# Patient Record
Sex: Female | Born: 1985 | Race: Black or African American | Hispanic: No | Marital: Married | Smoking: Never smoker
Health system: Southern US, Community
[De-identification: ages and names within clinical notes are randomized; demographics above are authoritative.]

## PROBLEM LIST (undated history)

## (undated) NOTE — *Deleted (*Deleted)
Patient ID: Gabrielle Singh, female   DOB: [birth year], 60 y.o.   MRN: [id number]   After hospitalization 10/15-10/22/2021 and subsequent rehab 10/29-10/30(which she left AMA.)  From discharge summary: Disposition and follow-up:   Gabrielle Singh was discharged from [facility] in [place] condition.  At the hospital follow up visit please address:  1. Iron deficiency anemia 2/2 blood loss anemia. Severe ulceration vs pancreatitis vs malignancy in distal stomach or proximal duodenum. Ferritin 6, iron 15. Concern for GI bleeding, PUD vs malignancy. Could also be from chronic malnutrition. Received IV feraheme x2 and 1 unit pRBCs with appropriate response. Treating with protonix 40mg  BID. She declined EGD, please recheck CBC and consider working on agreement to proceed with EGD.   2. Hypoglycemia. No history of diabetes and not on any diabetic medications. Could be from depleted glycogen stores or impaired gluconeogenesis. CBGs tend to go <70 when she does not eat. Serum insulin and c-peptide WNL. AM cortisol WNL. Recommend seeing an endocrinologist as outpatient.  3. Chronic malnutrition with associated electrolyte abnormalities. Hx of bulimia in her teens. Likely has an eating disorder. Has not been eating well for past 2 years. Albumin 1.4. Have been encouraging increased food intake. She would benefit from working with a therapist and nutritionist. Will be discharging to [facility] for further care.  4. Left arm flaccid paralysis. Also with variable bilateral leg weakness. Negative CT head, MRI brain, and MRI C-spine. May benefit from outpatient EMG and nerve conduction studies. Hopefully will benefit from CIR care, including PMR and neuropsych. Will need ongoing work with neuro and psych.   2.  Labs / imaging needed at time of follow-up: CBC, BMP  3.  Pending labs/ test needing follow-up: HbA1c  Follow-up Appointments:  Hospital Course by problem list: 1. Left-sided abdominal  pain: CT abd/pel showing suspected non-perforated posterolateral wall duodenal bulb ulcer with prominent fluid within pancreaticoduodenal groove, concerning for severe ulceration vs pancreatitis vs malignancy in distal stomach or proximal duodenum. Pain resolved without intervention.   Patient appears to have an underlying undiagnosed psychiatric illness that may be contributing to her current presentation.She does have a reported history of bulimia, possible underlying anxiety and eating disorder. GI was consulted and recommended an EGD, however the patient refused consent for the procedure. Per GI's recommendation she is being treated empirically with Protonix 40 mg BID.Patient is not complaining of abdominal pain. Overall the recommendation is follow up outpatient with her PCP. She should consider an upper EGD at a later date, or alternatively obtain a repeat CT scan in a month or so.  Iron deficiency anemia: Clear iron deficiency with ferritin 6, iron 15. Concern for GI bleeding, possibly PUD vs malignancy.Other possibility would be malnutrition or malabsorption.Patient received IV iron x2and 1 unit pRBCs with appropriate response. Hemoglobin has since remained stable. Hgb at discharge is 9.0,LDH is 93, and haptoglobin is 94.   Hypoglycemia: Patient with no history of diabetes or on any diabetic medications.Possibly due to depleted glycogen stores with chronic malnutrition.Patient's CBGs have been inconsistent with PO intake and have required supplementation with IV dextrose for CBGs <70. We considered the possibility of insulinoma, and exogenous insulin use, and ordered lab values for insulin (<0.4), C-peptide (0.9), and sulfonylurea (still pending). We screened for adrenal insufficieny however, unlikely provided her hypokalemia and her AM cortisol level was 12.1 (WNL). It is our recommendation that the patient follow up with outpatient endocrinology for further assessment.    ModerateMalnutrition (Hypokalemia, hypomagnesemia): Husband reports that patient  has not been eating well for past 2 years.She reports no intake for past week before admission. At high risk for refeeding syndrome. Her hepatic panel was significant for decreasedalbumin 1.4 and total protein 5.2, otherwise normal. Urinalysis was significant for proteinuria and ketonuria. We supplemented her with IV thiamine, folate, MVA, Kcl and Mg. Her potassium and magnesium levels have have remained low despite repletion. Her electrolytes should continued to be monitored after discharge. Our recommendation is that the patient consider outpatient resources for eating disorders, including working with a therapist and nutritionist to improve her quality of life.   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 CT head was negative for acute intracranial abnormality. MRI brain and c-spine were also negative. We consulted with Dr. Amada Jupiter from Neurology, and he believes that there is nothing to do in the inpatient setting. He advised that patient may benefit from EMG and nerve conduction studies as an outpatient. Our PT/OT colleagues worked with her to improve strength and functionality. Their recommendation was that the patient needs 24 hours assistance and would benefit from inpatient rehabilitation.   Pressure Injury: She had a stage 1 nonblanchable coccyx wound over her bony prominence at admission. It was measured as 1 cm x 0.8 cm.Recommended dressing changed every 3 days, unless soiled.   From discharge summary after leaving physical rehab 10/30:  Brief HPI:   Gabrielle Singh is a 60 y.o. right-handed female with history of bulimia as a teenager rheumatoid arthritis that reportedly was diagnosed during a telehealth visit although on no medications, chronic anemia.  Patient not received medical care in the past 8 years.   Presented 04/15/2020 with left-sided weakness abdominal pain as well as intermittent left-sided weakness.  Patient reportedly notes this is progressed over the last 4 months.  Admission chemistry sodium 134 potassium 3.4 BUN 6 creatinine 0.62 hemoglobin 8.2 lipase 34 TSH within normal limits.  Cranial CT scan MRI negative for acute changes MRI cervical spine unremarkable.  CT angiogram of the chest with no pulmonary emboli.  CTA of the abdomen pelvis showed grooved pancreatitis with secondary duodenitis although felt to be less likely but not excluded.  Thickened proximal duodenum with suspected nonperforated ulceration along the posterior lateral wall of the duodenal bulb with prominent fluid within the pancreaticoduodenal groove.  Gastroenterology consulted recommendations of upper endoscopy of which patient declined.  She was placed on PPI 40 mg twice daily.  Maintain on a regular diet.  There was some suggestion symptoms were from underlying undiagnosed psychiatric illness contributing to her current presentation.  Iron deficiency anemia she did receive IV iron x2 with 1 unit of packed red blood cells with latest hemoglobin 9.0.  In regards to patient's left arm and leg weakness CT and MRI were negative spoke with neurology services Dr. Amada Jupiter nothing further to add on inpatient basis consider EMG outpatient.  There was a documented wound stage I nonblanchable coccyx over bony prominence dressing changes as directed.  Patient was admitted for a comprehensive rehab program.   Hospital Course: Gabrielle Singh was admitted to rehab 04/21/2020 for inpatient therapies to consist of PT, ST and OT at least three hours five days a week. Past admission physiatrist, therapy team and rehab RN have worked together to provide customized collaborative inpatient rehab.  Pertaining to patient's debility related iron deficiency anemia failure to thrive as well as left-sided abdominal pain.  Left upper extremity weakness  of unknown origin CT MRI negative.  She had refused any GI work-up with EGD noted.  SCDs for DVT prophylaxis.  There was some suspect overlying psychiatric issues impacting functional deficits.  It was recommended she could follow-up with neuropsychology.  In regards to the left side abdominal pain she remained on PPI again declined EGD.  Latest hemoglobin 9.0.   Blood pressures were monitored on TID basis and stable and monitored     Rehab course: During patient's stay in rehab weekly team conferences were held to monitor patient's progress, set goals and discuss barriers to discharge. At admission, patient required moderate assist squat pivot transfers total assist supine to sit.  Moderate assist upper body bathing total assist lower body bathing max assist upper body dressing total assist lower body dressing.  It was noted patient needing ongoing encouragement to participate  Gabrielle Singh  has had improvement in activity tolerance, balance, postural control as well as ability to compensate for deficits. Gabrielle Singh has had improvement in functional use RUE/LUE  and RLE/LLE as well as improvement in awareness.  Patient ultimately declined of physical and occupational therapy and she insisted on being discharged to home.  Multiple discussions were held with patient in regards to maintaining therapy regimen.  Significant other contacted about these developments.  Patient did leave AMA.

## (undated) NOTE — *Deleted (*Deleted)
Daily Rounding Note  04/17/2020, 9:52 AM  LOS: 2 days   SUBJECTIVE:   Chief complaint: duodenitis, duodenal ulcer, ?groove pancretitis.  .      ***  OBJECTIVE:         Vital signs in last 24 hours:    Temp:  [98.2 F (36.8 C)-99.2 F (37.3 C)] 98.3 F (36.8 C) (10/25 0844) Pulse Rate:  [91-102] 91 (10/25 0844) Resp:  [14-18] 16 (10/25 0844) BP: (96-108)/(59-94) 96/62 (10/25 0844) SpO2:  [99 %-100 %] 100 % (10/25 0844) Weight:  [49.8 kg] 49.8 kg (10/24 2052) Last BM Date: 04/16/20 Filed Weights   04/16/20 2052  Weight: 49.8 kg   General: ***   Heart: *** Chest: *** Abdomen: ***  Extremities: *** Neuro/Psych:  ***  Intake/Output from previous day: 10/24 0701 - 10/25 0700 In: 1.7 [I.V.:1.7] Out: 550 [Urine:550]  Intake/Output this shift: No intake/output data recorded.  Lab Results: Recent Labs    04/15/20 1334 04/15/20 1334 04/15/20 1342 04/16/20 0240 04/17/20 0332  WBC 5.8  --   --  5.3 4.6  HGB 8.2*   < > 9.5* 7.9* 7.0*  HCT 27.2*   < > 28.0* 25.9* 23.2*  PLT 410*  --   --  312 270   < > = values in this interval not displayed.   BMET Recent Labs    04/15/20 1334 04/15/20 1334 04/15/20 1342 04/16/20 0240 04/17/20 0332  NA 134*   < > 135 136 135  K 3.4*   < > 3.5 3.8 3.6  CL 97*   < > 95* 103 101  CO2 27  --   --  25 24  GLUCOSE 85   < > 86 67* 56*  BUN 6   < > 5* <5* <5*  CREATININE 0.62   < > 0.50 0.56 0.64  CALCIUM 8.9  --   --  8.5* 8.2*   < > = values in this interval not displayed.   LFT Recent Labs    04/15/20 1334 04/16/20 0240 04/17/20 0332  PROT 7.3 6.2* 5.6*  ALBUMIN 2.1* 1.7* 1.5*  AST 32 24 20  ALT 17 15 12   ALKPHOS 82 70 60  BILITOT 0.7 0.8 2.0*   PT/INR Recent Labs    04/15/20 1334  LABPROT 13.2  INR 1.1   Hepatitis Panel No results for input(s): HEPBSAG, HCVAB, HEPAIGM, HEPBIGM in the last 72 hours.  Studies/Results: CT HEAD WO CONTRAST   Result Date: 04/15/2020 CLINICAL DATA:  Left-sided weakness EXAM: CT HEAD WITHOUT CONTRAST TECHNIQUE: Contiguous axial images were obtained from the base of the skull through the vertex without intravenous contrast. COMPARISON:  None. FINDINGS: Brain: No evidence of acute infarction, hemorrhage, hydrocephalus, extra-axial collection or mass lesion/mass effect. Vascular: No hyperdense vessel or unexpected calcification. Skull: Normal. Negative for fracture or focal lesion. Sinuses/Orbits: Mucosal thickening within the bilateral maxillary sinuses, left worse than right. Remaining paranasal sinuses and mastoid air cells are clear. Orbital structures unremarkable. Other: None. IMPRESSION: 1. No acute intracranial findings. 2. Bilateral maxillary sinus disease. Electronically Signed   By: Duanne Guess D.O.   On: 04/15/2020 14:18   CT Angio Chest PE W/Cm &/Or Wo Cm  Result Date: 04/15/2020 CLINICAL DATA:  Failure to thrive, left-sided weakness, left upper quadrant pain EXAM: CT ANGIOGRAPHY CHEST CT ABDOMEN AND PELVIS WITH CONTRAST TECHNIQUE: Multidetector CT imaging of the chest was performed using the standard protocol during bolus administration of intravenous  contrast. Multiplanar CT image reconstructions and MIPs were obtained to evaluate the vascular anatomy. Multidetector CT imaging of the abdomen and pelvis was performed using the standard protocol during bolus administration of intravenous contrast. CONTRAST:  OMNIPAQUE IOHEXOL 350 MG/ML SOLN COMPARISON:  None. FINDINGS: CTA CHEST FINDINGS Cardiovascular: Satisfactory opacification of the pulmonary arteries to the segmental level. No evidence of pulmonary embolism. Thoracic aorta is normal in course and caliber. Four vessel arch, an anatomic variant. Normal heart size. No pericardial effusion. Mediastinum/Nodes: No enlarged mediastinal, hilar, or axillary lymph nodes. Thyroid gland, trachea, and esophagus demonstrate no significant findings.  Lungs/Pleura: Lungs are clear. No pleural effusion or pneumothorax. Musculoskeletal: Mild dextrocurvature of the thoracic spine. No acute osseous abnormality. No chest wall abnormality. Review of the MIP images confirms the above findings. CT ABDOMEN and PELVIS FINDINGS Hepatobiliary: No focal liver abnormality is seen. No gallstones, gallbladder wall thickening, or biliary dilatation. Pancreas: No definite pancreatic parenchymal edema. There is fluid within the pancreaticoduodenal groove. No pancreatic ductal dilatation. Spleen: Normal in size without focal abnormality. Adrenals/Urinary Tract: Unremarkable adrenal glands. Kidneys enhance symmetrically without focal lesion, stone, or hydronephrosis. Ureters are nondilated. Urinary bladder appears unremarkable. Stomach/Bowel: Small sliding type hiatal hernia. Mildly thickened rugal folds within the distal stomach with mucosal hyperenhancement. The proximal duodenum is also thickened with suspected non perforated ulceration along the posterolateral wall of the duodenal bulb (series 3, image 30; series 7, image 54) with prominent fluid within the pancreaticoduodenal groove. Appendix appears unremarkable (series 6, images 34-42). No focal colonic wall thickening or inflammatory changes. Prominent stool ball within the rectum measuring 5.8 cm in diameter. Vascular/Lymphatic: No acute vascular abnormality. There are a few mildly prominent pancreaticoduodenal lymph nodes, likely reactive. No abdominopelvic lymphadenopathy. Reproductive: Anteverted uterus with probable nabothian cysts. Physiologic cyst or follicle in the region of the right ovary. Adnexal regions otherwise unremarkable. Other: No organized abdominopelvic fluid collection. No pneumoperitoneum. Musculoskeletal: Degenerative changes of the bilateral SI joints. No acute osseous findings. Review of the MIP images confirms the above findings. IMPRESSION: 1. No evidence of pulmonary embolism or acute  cardiopulmonary disease. 2. Thickened proximal duodenum with suspected non-perforated ulceration along the posterolateral wall of the duodenal bulb with prominent fluid within the pancreaticoduodenal groove. Findings are concerning for duodenitis/peptic ulcer disease. Consider endoscopic evaluation. 3. Groove pancreatitis with secondary duodenitis, although felt to be less likely, is not excluded. Correlation with serum pancreatic enzymes is recommended. 4. Prominent stool ball within the rectum measuring 5.8 cm in diameter. Correlate for constipation. Electronically Signed   By: Duanne Guess D.O.   On: 04/15/2020 16:38   CT Abdomen Pelvis W Contrast  Result Date: 04/15/2020 CLINICAL DATA:  Failure to thrive, left-sided weakness, left upper quadrant pain EXAM: CT ANGIOGRAPHY CHEST CT ABDOMEN AND PELVIS WITH CONTRAST TECHNIQUE: Multidetector CT imaging of the chest was performed using the standard protocol during bolus administration of intravenous contrast. Multiplanar CT image reconstructions and MIPs were obtained to evaluate the vascular anatomy. Multidetector CT imaging of the abdomen and pelvis was performed using the standard protocol during bolus administration of intravenous contrast. CONTRAST:  OMNIPAQUE IOHEXOL 350 MG/ML SOLN COMPARISON:  None. FINDINGS: CTA CHEST FINDINGS Cardiovascular: Satisfactory opacification of the pulmonary arteries to the segmental level. No evidence of pulmonary embolism. Thoracic aorta is normal in course and caliber. Four vessel arch, an anatomic variant. Normal heart size. No pericardial effusion. Mediastinum/Nodes: No enlarged mediastinal, hilar, or axillary lymph nodes. Thyroid gland, trachea, and esophagus demonstrate no significant findings. Lungs/Pleura:  Lungs are clear. No pleural effusion or pneumothorax. Musculoskeletal: Mild dextrocurvature of the thoracic spine. No acute osseous abnormality. No chest wall abnormality. Review of the MIP images confirms  the above findings. CT ABDOMEN and PELVIS FINDINGS Hepatobiliary: No focal liver abnormality is seen. No gallstones, gallbladder wall thickening, or biliary dilatation. Pancreas: No definite pancreatic parenchymal edema. There is fluid within the pancreaticoduodenal groove. No pancreatic ductal dilatation. Spleen: Normal in size without focal abnormality. Adrenals/Urinary Tract: Unremarkable adrenal glands. Kidneys enhance symmetrically without focal lesion, stone, or hydronephrosis. Ureters are nondilated. Urinary bladder appears unremarkable. Stomach/Bowel: Small sliding type hiatal hernia. Mildly thickened rugal folds within the distal stomach with mucosal hyperenhancement. The proximal duodenum is also thickened with suspected non perforated ulceration along the posterolateral wall of the duodenal bulb (series 3, image 30; series 7, image 54) with prominent fluid within the pancreaticoduodenal groove. Appendix appears unremarkable (series 6, images 34-42). No focal colonic wall thickening or inflammatory changes. Prominent stool ball within the rectum measuring 5.8 cm in diameter. Vascular/Lymphatic: No acute vascular abnormality. There are a few mildly prominent pancreaticoduodenal lymph nodes, likely reactive. No abdominopelvic lymphadenopathy. Reproductive: Anteverted uterus with probable nabothian cysts. Physiologic cyst or follicle in the region of the right ovary. Adnexal regions otherwise unremarkable. Other: No organized abdominopelvic fluid collection. No pneumoperitoneum. Musculoskeletal: Degenerative changes of the bilateral SI joints. No acute osseous findings. Review of the MIP images confirms the above findings. IMPRESSION: 1. No evidence of pulmonary embolism or acute cardiopulmonary disease. 2. Thickened proximal duodenum with suspected non-perforated ulceration along the posterolateral wall of the duodenal bulb with prominent fluid within the pancreaticoduodenal groove. Findings are concerning  for duodenitis/peptic ulcer disease. Consider endoscopic evaluation. 3. Groove pancreatitis with secondary duodenitis, although felt to be less likely, is not excluded. Correlation with serum pancreatic enzymes is recommended. 4. Prominent stool ball within the rectum measuring 5.8 cm in diameter. Correlate for constipation. Electronically Signed   By: Duanne Guess D.O.   On: 04/15/2020 16:38    ASSESMENT:   *  Duodenitis, DU.  ? Groove pancreatitis.   Lipase in 30s.  T bili 2, O/w normal LFTs.    *   IDA anemia. S/p Feraheme on 10/24.   No transfusion to date.   Hgb 7.  FOBT status not yet determined.    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  Head CT w bil maxillary sinusitis, O/w unremarkable.    *   Depression, eating disorder.    *   Wt loss, PCM of undetermined severity.  Low albumin   PLAN   ***  *   Pre-albumin, CBC in AM.      Gabrielle Singh  04/17/2020, 9:52 AM Phone [phone]

## (undated) NOTE — *Deleted (*Deleted)
Subjective:    Objective:  Vital signs in last 24 hours: Vitals:   04/19/20 0850 04/19/20 1638 04/19/20 2146 04/20/20 0658  BP: (!) 90/55 (!) 92/56 (!) 94/56 (!) 98/59  Pulse: 72 71 83 79  Resp: 18 18 16 18   Temp: 98 F (36.7 C) 98.6 F (37 C) 98.5 F (36.9 C) 98.9 F (37.2 C)  TempSrc: Oral Oral Oral Oral  SpO2: 100% 100% 98% (!) 87%  Weight:      Height:       Weight change:   Intake/Output Summary (Last 24 hours) at 04/20/2020 1610 Last data filed at 04/20/2020 0200 Gross per 24 hour  Intake 1679.72 ml  Output 0 ml  Net 1679.72 ml     Assessment/Plan:  Principal Problem:   Acute on chronic blood loss anemia Active Problems:   Abdominal pain   Pressure injury of skin   Left arm weakness   Iron deficiency anemia   Hypoglycemia   Abnormal CT scan, gastrointestinal tract  9 yo woman with no PMH other than bulimia as a teenager and 2 normal pregnancies who presented with acute left-sided abdominal/flank pain, poor appetite/eating, and 6 months of progressive weakness in her left arm and both legs. No medical care in past 8 years. Hospital course has been significant for recurrent hypoglycemia, progressive anemia requiring transfusion, and anxiety about hospitalization and medical procedures.   Iron deficiency anemia 2/2 blood loss anemia Left-sided abdominal pain Clear iron deficiency with ferritin 6, iron 15. Concern for GI bleeding, possibly PUD vs malignancy. Other possibility would be malnutrition or malabsorption. Retic index is low, likely hypoproliferation due to iron deficiency. Received IV iron x1 and 1 unit pRBCs with appropriate response, Hgb today 8.4. LDH 93, haptoglobin 94 - likely not hemolysis. Still not consenting to EGD. CT abd/pel showing suspected non-perforated posterolateral wall duodenal bulb ulcer with prominent fluid within pancreaticoduodenal groove, concerning for severe ulceration vs pancreatitis vs malignancy in distal stomach or  proximal duodenum. She also has iron deficiency anemia of unclear etiology (see above), concerning for GI blood loss. Patient appears to have an underlying undiagnosed psychiatric illness that may be contributing to her current presentation. She does have a reported history of bulimia, possible underlying anxiety and eating disorder. Still not consenting to EGD, GI will plan to treat empirically with PPI. - Patient opted out of the EGD procedure for now - Given one dose of IV feraheme on 04/19/20, will give additional dose today - Continue PPI - daily CBC - daily BMP - started on regular diet   Hypoglycemia Patient with no history of diabetes or on any diabetic medications. Obtained serum glucose, serum insulin, sulfonylurea level, and serum c-peptide to evaluate for endogenous vs exogenous causes of hypoglycemia. After adding IV dextrose, patient's CBGs have been 101 > 95 > 92. Possibly due to depleted glycogen stores with chronic malnutrition. - serum insulin (<.4) and c-peptide (0.9) were appropriately low  - f/u sulfonylurea level (pending) - IV dextrose for CBG <70   Left arm flaccid paralysis Weakness of both legs She reports she has been unable to move left arm or left leg for the past 6 months. CT head was negative for acute intracranial abnormality. MRI brain and c-spine were also negative. Spoke with Dr. Amada Jupiter (Neurology), he believes that there is nothing to do in the inpatient setting. He advised that patient may benefit from EMG and nerve conduction studies as an outpatient. - PT/OT eval, recommending CIR   Pressure Injury  Presumed coccyx medial stage 2 - partial thickness loss of dermis noted as an open injury with a red-pink wound bed without sloughing. On examination, it does not appear to be a stage 2 ulcer, however it was tender to touch - wound care was consulted. We will continue to monitor it.  - Dressing to be changed every 3 days, unless soiled  Chronic  Malnutrition Husband reports that patient has not been eating well for past 2 years. She reports no intake for past week before admission. At high risk for refeeding syndrome.  - albumin 2.1 > 1.7 > 1.5 - urinalysis showing proteinuria, ketonuria - protein-to-creatinine ratio 1.15 - IV thiamine - folate, MVA after off NPO status  - B12 level wnl, folate 4.9 - Daily Chem10 to watch for refeeding syndrome, will replete electrolytes   Prior to Admission Living Arrangement: Home Anticipated Discharge Location: TBD Barriers to Discharge: continued medical management Dispo: TBD    LOS: 5 days   Merrilyn Puma, MD 04/20/2020, 7:11 AM

---

## 2010-12-15 ENCOUNTER — Inpatient Hospital Stay (HOSPITAL_COMMUNITY)
Admission: AD | Admit: 2010-12-15 | Discharge: 2010-12-17 | DRG: 775 | Disposition: A | Discharge status: Home / Self Care | Payer: Self-pay | Source: Ambulatory Visit | Attending: Obstetrics & Gynecology | Admitting: Obstetrics & Gynecology

## 2010-12-15 DIAGNOSIS — O0930 Supervision of pregnancy with insufficient antenatal care, unspecified trimester: Secondary | ICD-10-CM

## 2010-12-15 LAB — DIFFERENTIAL
Basophils Relative: 0 % (ref 0–1)
Eosinophils Relative: 0 % (ref 0–5)
Lymphs Abs: 1.4 10*3/uL (ref 0.7–4.0)
Monocytes Absolute: 0.4 10*3/uL (ref 0.1–1.0)
Monocytes Relative: 4 % (ref 3–12)
Neutrophils Relative %: 82 % — ABNORMAL HIGH (ref 43–77)
WBC Morphology: INCREASED

## 2010-12-15 LAB — CBC
HCT: 31 % — ABNORMAL LOW (ref 36.0–46.0)
Hemoglobin: 9.3 g/dL — ABNORMAL LOW (ref 12.0–15.0)
MCV: 73.8 fL — ABNORMAL LOW (ref 78.0–100.0)
RDW: 19.6 % — ABNORMAL HIGH (ref 11.5–15.5)
WBC: 10.3 10*3/uL (ref 4.0–10.5)

## 2010-12-15 LAB — TYPE AND SCREEN
ABO/RH(D): B POS
Antibody Screen: NEGATIVE

## 2010-12-15 LAB — RAPID URINE DRUG SCREEN, HOSP PERFORMED
Amphetamines: NOT DETECTED
Opiates: NOT DETECTED

## 2010-12-15 LAB — HEPATITIS B SURFACE ANTIGEN: Hepatitis B Surface Ag: NEGATIVE

## 2010-12-15 LAB — RUBELLA SCREEN: Rubella: 18.7 IU/mL — ABNORMAL HIGH

## 2010-12-15 LAB — ABO/RH: ABO/RH(D): B POS

## 2010-12-15 LAB — GLUCOSE, CAPILLARY: Glucose-Capillary: 98 mg/dL (ref 70–99)

## 2010-12-16 ENCOUNTER — Other Ambulatory Visit (HOSPITAL_COMMUNITY): Payer: Self-pay | Admitting: Obstetrics and Gynecology

## 2010-12-16 LAB — RPR: RPR Ser Ql: NONREACTIVE

## 2020-04-15 ENCOUNTER — Other Ambulatory Visit: Payer: Self-pay

## 2020-04-15 ENCOUNTER — Inpatient Hospital Stay (HOSPITAL_COMMUNITY)
Admission: EM | Admit: 2020-04-15 | Discharge: 2020-04-21 | DRG: 377 | Disposition: A | Discharge status: Rehabilitation Facility | Payer: Self-pay | Attending: Internal Medicine | Admitting: Internal Medicine

## 2020-04-15 ENCOUNTER — Emergency Department (HOSPITAL_COMMUNITY): Payer: Self-pay

## 2020-04-15 ENCOUNTER — Encounter (HOSPITAL_COMMUNITY): Payer: Self-pay | Admitting: Emergency Medicine

## 2020-04-15 DIAGNOSIS — F1010 Alcohol abuse, uncomplicated: Secondary | ICD-10-CM | POA: Diagnosis present

## 2020-04-15 DIAGNOSIS — D509 Iron deficiency anemia, unspecified: Secondary | ICD-10-CM | POA: Diagnosis present

## 2020-04-15 DIAGNOSIS — K859 Acute pancreatitis without necrosis or infection, unspecified: Secondary | ICD-10-CM | POA: Diagnosis present

## 2020-04-15 DIAGNOSIS — K644 Residual hemorrhoidal skin tags: Secondary | ICD-10-CM | POA: Diagnosis present

## 2020-04-15 DIAGNOSIS — C169 Malignant neoplasm of stomach, unspecified: Secondary | ICD-10-CM | POA: Diagnosis present

## 2020-04-15 DIAGNOSIS — R809 Proteinuria, unspecified: Secondary | ICD-10-CM | POA: Diagnosis not present

## 2020-04-15 DIAGNOSIS — E162 Hypoglycemia, unspecified: Secondary | ICD-10-CM | POA: Diagnosis not present

## 2020-04-15 DIAGNOSIS — R29898 Other symptoms and signs involving the musculoskeletal system: Secondary | ICD-10-CM | POA: Diagnosis present

## 2020-04-15 DIAGNOSIS — R634 Abnormal weight loss: Secondary | ICD-10-CM | POA: Diagnosis present

## 2020-04-15 DIAGNOSIS — Z5329 Procedure and treatment not carried out because of patient's decision for other reasons: Secondary | ICD-10-CM | POA: Diagnosis not present

## 2020-04-15 DIAGNOSIS — E876 Hypokalemia: Secondary | ICD-10-CM | POA: Diagnosis present

## 2020-04-15 DIAGNOSIS — F32A Depression, unspecified: Secondary | ICD-10-CM | POA: Diagnosis present

## 2020-04-15 DIAGNOSIS — L8990 Pressure ulcer of unspecified site, unspecified stage: Secondary | ICD-10-CM | POA: Diagnosis present

## 2020-04-15 DIAGNOSIS — E46 Unspecified protein-calorie malnutrition: Secondary | ICD-10-CM

## 2020-04-15 DIAGNOSIS — R824 Acetonuria: Secondary | ICD-10-CM | POA: Diagnosis not present

## 2020-04-15 DIAGNOSIS — F1020 Alcohol dependence, uncomplicated: Secondary | ICD-10-CM

## 2020-04-15 DIAGNOSIS — K260 Acute duodenal ulcer with hemorrhage: Principal | ICD-10-CM | POA: Diagnosis present

## 2020-04-15 DIAGNOSIS — K2900 Acute gastritis without bleeding: Secondary | ICD-10-CM

## 2020-04-15 DIAGNOSIS — R627 Adult failure to thrive: Secondary | ICD-10-CM | POA: Diagnosis present

## 2020-04-15 DIAGNOSIS — L89151 Pressure ulcer of sacral region, stage 1: Secondary | ICD-10-CM | POA: Diagnosis present

## 2020-04-15 DIAGNOSIS — Z20822 Contact with and (suspected) exposure to covid-19: Secondary | ICD-10-CM | POA: Diagnosis present

## 2020-04-15 DIAGNOSIS — K5909 Other constipation: Secondary | ICD-10-CM | POA: Diagnosis present

## 2020-04-15 DIAGNOSIS — F419 Anxiety disorder, unspecified: Secondary | ICD-10-CM | POA: Diagnosis present

## 2020-04-15 DIAGNOSIS — R933 Abnormal findings on diagnostic imaging of other parts of digestive tract: Secondary | ICD-10-CM

## 2020-04-15 DIAGNOSIS — J320 Chronic maxillary sinusitis: Secondary | ICD-10-CM | POA: Diagnosis present

## 2020-04-15 DIAGNOSIS — R109 Unspecified abdominal pain: Secondary | ICD-10-CM | POA: Diagnosis present

## 2020-04-15 DIAGNOSIS — Z681 Body mass index (BMI) 19 or less, adult: Secondary | ICD-10-CM

## 2020-04-15 DIAGNOSIS — G4700 Insomnia, unspecified: Secondary | ICD-10-CM | POA: Diagnosis present

## 2020-04-15 DIAGNOSIS — E440 Moderate protein-calorie malnutrition: Secondary | ICD-10-CM

## 2020-04-15 DIAGNOSIS — E538 Deficiency of other specified B group vitamins: Secondary | ICD-10-CM | POA: Diagnosis present

## 2020-04-15 DIAGNOSIS — G8389 Other specified paralytic syndromes: Secondary | ICD-10-CM | POA: Diagnosis present

## 2020-04-15 DIAGNOSIS — Z8659 Personal history of other mental and behavioral disorders: Secondary | ICD-10-CM

## 2020-04-15 DIAGNOSIS — R531 Weakness: Secondary | ICD-10-CM

## 2020-04-15 DIAGNOSIS — D62 Acute posthemorrhagic anemia: Secondary | ICD-10-CM | POA: Diagnosis present

## 2020-04-15 DIAGNOSIS — M6284 Sarcopenia: Secondary | ICD-10-CM | POA: Diagnosis present

## 2020-04-15 DIAGNOSIS — K2971 Gastritis, unspecified, with bleeding: Secondary | ICD-10-CM | POA: Diagnosis present

## 2020-04-15 LAB — DIFFERENTIAL
Abs Immature Granulocytes: 0 10*3/uL (ref 0.00–0.07)
Basophils Absolute: 0.1 10*3/uL (ref 0.0–0.1)
Basophils Relative: 1 %
Eosinophils Absolute: 0 10*3/uL (ref 0.0–0.5)
Eosinophils Relative: 0 %
Lymphocytes Relative: 27 %
Lymphs Abs: 1.6 10*3/uL (ref 0.7–4.0)
Monocytes Absolute: 0.2 10*3/uL (ref 0.1–1.0)
Monocytes Relative: 4 %
Neutro Abs: 3.9 10*3/uL (ref 1.7–7.7)
Neutrophils Relative %: 68 %
nRBC: 0 /100{WBCs}

## 2020-04-15 LAB — I-STAT CHEM 8, ED
BUN: 5 mg/dL — ABNORMAL LOW (ref 6–20)
Calcium, Ion: 1.19 mmol/L (ref 1.15–1.40)
Chloride: 95 mmol/L — ABNORMAL LOW (ref 98–111)
Creatinine, Ser: 0.5 mg/dL (ref 0.44–1.00)
Glucose, Bld: 86 mg/dL (ref 70–99)
HCT: 28 % — ABNORMAL LOW (ref 36.0–46.0)
Hemoglobin: 9.5 g/dL — ABNORMAL LOW (ref 12.0–15.0)
Potassium: 3.5 mmol/L (ref 3.5–5.1)
Sodium: 135 mmol/L (ref 135–145)
TCO2: 28 mmol/L (ref 22–32)

## 2020-04-15 LAB — FERRITIN: Ferritin: 6 ng/mL — ABNORMAL LOW (ref 11–307)

## 2020-04-15 LAB — CBC
HCT: 27.2 % — ABNORMAL LOW (ref 36.0–46.0)
Hemoglobin: 8.2 g/dL — ABNORMAL LOW (ref 12.0–15.0)
MCH: 27.1 pg (ref 26.0–34.0)
MCHC: 30.1 g/dL (ref 30.0–36.0)
MCV: 89.8 fL (ref 80.0–100.0)
Platelets: 410 10*3/uL — ABNORMAL HIGH (ref 150–400)
RBC: 3.03 MIL/uL — ABNORMAL LOW (ref 3.87–5.11)
RDW: 22.8 % — ABNORMAL HIGH (ref 11.5–15.5)
WBC: 5.8 10*3/uL (ref 4.0–10.5)
nRBC: 0 % (ref 0.0–0.2)

## 2020-04-15 LAB — IRON AND TIBC
Iron: 15 ug/dL — ABNORMAL LOW (ref 28–170)
Saturation Ratios: 4 % — ABNORMAL LOW (ref 10.4–31.8)
TIBC: 367 ug/dL (ref 250–450)
UIBC: 352 ug/dL

## 2020-04-15 LAB — I-STAT BETA HCG BLOOD, ED (MC, WL, AP ONLY): I-stat hCG, quantitative: <5 m[IU]/mL

## 2020-04-15 LAB — COMPREHENSIVE METABOLIC PANEL
ALT: 17 U/L (ref 0–44)
AST: 32 U/L (ref 15–41)
Albumin: 2.1 g/dL — ABNORMAL LOW (ref 3.5–5.0)
Alkaline Phosphatase: 82 U/L (ref 38–126)
Anion gap: 10 (ref 5–15)
BUN: 6 mg/dL (ref 6–20)
CO2: 27 mmol/L (ref 22–32)
Calcium: 8.9 mg/dL (ref 8.9–10.3)
Chloride: 97 mmol/L — ABNORMAL LOW (ref 98–111)
Creatinine, Ser: 0.62 mg/dL (ref 0.44–1.00)
GFR, Estimated: >60 mL/min (ref >60)
Glucose, Bld: 85 mg/dL (ref 70–99)
Potassium: 3.4 mmol/L — ABNORMAL LOW (ref 3.5–5.1)
Sodium: 134 mmol/L — ABNORMAL LOW (ref 135–145)
Total Bilirubin: 0.7 mg/dL (ref 0.3–1.2)
Total Protein: 7.3 g/dL (ref 6.5–8.1)

## 2020-04-15 LAB — PROTIME-INR
INR: 1.1 (ref 0.8–1.2)
Prothrombin Time: 13.2 s (ref 11.4–15.2)

## 2020-04-15 LAB — RETICULOCYTES
Immature Retic Fract: 20.3 % — ABNORMAL HIGH (ref 2.3–15.9)
RBC.: 2.85 MIL/uL — ABNORMAL LOW (ref 3.87–5.11)
Retic Count, Absolute: 61 10*3/uL (ref 19.0–186.0)
Retic Ct Pct: 2.1 % (ref 0.4–3.1)

## 2020-04-15 LAB — RESPIRATORY PANEL BY RT PCR (FLU A&B, COVID)
Influenza A by PCR: NEGATIVE
Influenza B by PCR: NEGATIVE
SARS Coronavirus 2 by RT PCR: NEGATIVE

## 2020-04-15 LAB — CBG MONITORING, ED: Glucose-Capillary: 77 mg/dL (ref 70–99)

## 2020-04-15 LAB — VITAMIN B12: Vitamin B-12: 789 pg/mL (ref 180–914)

## 2020-04-15 LAB — HIV ANTIBODY (ROUTINE TESTING W REFLEX): HIV Screen 4th Generation wRfx: NONREACTIVE

## 2020-04-15 LAB — TSH: TSH: 1.579 u[IU]/mL (ref 0.350–4.500)

## 2020-04-15 LAB — LIPASE, BLOOD: Lipase: 34 U/L (ref 11–51)

## 2020-04-15 LAB — APTT: aPTT: 25 seconds (ref 24–36)

## 2020-04-15 MED ORDER — IOHEXOL 350 MG/ML SOLN
100.0000 mL | Freq: Once | INTRAVENOUS | Status: AC | PRN
  Administered 2020-04-15: 100 mL via INTRAVENOUS

## 2020-04-15 MED ORDER — SODIUM CHLORIDE 0.9 % IV BOLUS
500.0000 mL | Freq: Once | INTRAVENOUS | Status: AC
  Administered 2020-04-15: 500 mL via INTRAVENOUS

## 2020-04-15 MED ORDER — LORAZEPAM 2 MG/ML IJ SOLN: 1.0000 mg | INTRAMUSCULAR | Status: DC | PRN

## 2020-04-15 MED ORDER — FOLIC ACID 1 MG PO TABS
1.0000 mg | ORAL_TABLET | Freq: Every day | ORAL | Status: DC
  Administered 2020-04-16 – 2020-04-20 (×5): 1 mg via ORAL
  Filled 2020-04-15 (×8): qty 1

## 2020-04-15 MED ORDER — ADULT MULTIVITAMIN W/MINERALS CH
1.0000 | ORAL_TABLET | Freq: Every day | ORAL | Status: DC
  Administered 2020-04-15 – 2020-04-20 (×6): 1 via ORAL
  Filled 2020-04-15 (×8): qty 1

## 2020-04-15 MED ORDER — PANTOPRAZOLE SODIUM 40 MG IV SOLR
40.0000 mg | Freq: Two times a day (BID) | INTRAVENOUS | Status: DC
  Administered 2020-04-16 – 2020-04-18 (×4): 40 mg via INTRAVENOUS
  Filled 2020-04-15 (×4): qty 40

## 2020-04-15 MED ORDER — PANTOPRAZOLE SODIUM 40 MG IV SOLR
40.0000 mg | Freq: Once | INTRAVENOUS | Status: AC
  Administered 2020-04-15: 40 mg via INTRAVENOUS
  Filled 2020-04-15: qty 40

## 2020-04-15 MED ORDER — THIAMINE HCL 100 MG PO TABS
100.0000 mg | ORAL_TABLET | Freq: Every day | ORAL | Status: DC
  Administered 2020-04-15 – 2020-04-20 (×5): 100 mg via ORAL
  Filled 2020-04-15 (×7): qty 1

## 2020-04-15 MED ORDER — LORAZEPAM 1 MG PO TABS: 1.0000 mg | ORAL_TABLET | ORAL | Status: DC | PRN

## 2020-04-15 MED ORDER — THIAMINE HCL 100 MG/ML IJ SOLN
100.0000 mg | Freq: Every day | INTRAMUSCULAR | Status: DC
  Administered 2020-04-16: 100 mg via INTRAVENOUS
  Filled 2020-04-15: qty 2

## 2020-04-15 MED ORDER — SODIUM CHLORIDE 0.9% FLUSH
3.0000 mL | Freq: Once | INTRAVENOUS | Status: AC
Start: 2020-04-15 — End: 2020-04-20
  Administered 2020-04-20: 3 mL via INTRAVENOUS

## 2020-04-15 MED ORDER — KCL-LACTATED RINGERS 20 MEQ/L IV SOLN
INTRAVENOUS | Status: AC
  Filled 2020-04-15: qty 20

## 2020-04-15 NOTE — ED Provider Notes (Signed)
[facility] EMERGENCY DEPARTMENT Provider Note   CSN: [id number] Arrival date & time: 04/15/20  1311     History Chief Complaint  Patient presents with  . Weakness  . Abdominal Pain    Gabrielle Singh is a 34 y.o. female.  The history is provided by the patient.  Weakness Associated symptoms: abdominal pain   Abdominal Pain  Gabrielle Singh is a 34 y.o. female who presents to the Emergency Department complaining of weakness and abdominal pain. She presents the emergency department for evaluation of left sided weakness as well as abdominal pain. She reports three months of left upper extremity weakness as well is one month of left lower extremity weakness. Symptoms are waxing and waning. She also complains of one day of severe left sided abdominal pain with numerous episodes of emesis. She has experienced weight loss over an unclear period of time. She has poor appetite. She denies any fevers, chest pain. She has no medical problems as has not seen a physician. She lives at home with her mother. She denies any family medical history. No tobacco use. Occasional alcohol use. No drug use.    History reviewed. No pertinent past medical history.  Patient Active Problem List   Diagnosis Date Noted  . Abdominal pain 04/15/2020    History reviewed. No pertinent surgical history.   OB History   No obstetric history on file.     No family history on file.  Social History   Tobacco Use  . Smoking status: Never Smoker  . Smokeless tobacco: Never Used  Substance Use Topics  . Alcohol use: Not Currently  . Drug use: Not Currently    Home Medications Prior to Admission medications   Not on File    Allergies    Patient has no known allergies.  Review of Systems   Review of Systems  Gastrointestinal: Positive for abdominal pain.  Neurological: Positive for weakness.  All other systems reviewed and are negative.   Physical Exam Updated Vital Signs BP  124/90 (BP Location: Left Arm)   Pulse 90   Temp 98.9 F (37.2 C) (Oral)   Resp 20   SpO2 100%   Physical Exam Vitals and nursing note reviewed.  Constitutional:      Appearance: She is well-developed.  HENT:     Head: Normocephalic and atraumatic.  Cardiovascular:     Rate and Rhythm: Regular rhythm. Tachycardia present.     Heart sounds: No murmur heard.   Pulmonary:     Effort: Pulmonary effort is normal. No respiratory distress.     Breath sounds: Normal breath sounds.  Abdominal:     Palpations: Abdomen is soft.     Tenderness: There is abdominal tenderness. There is no guarding or rebound.     Comments: Severe left-sided abdominal tenderness with voluntary guarding  Musculoskeletal:        General: No tenderness.  Skin:    General: Skin is warm and dry.  Neurological:     Mental Status: She is alert and oriented to person, place, and time.     Comments: Left upper extremity is flaccid. 3/5 strength in bilateral lower extremities.  Psychiatric:     Comments: Anxious appearing     ED Results / Procedures / Treatments   Labs (all labs ordered are listed, but only abnormal results are displayed) Labs Reviewed  CBC - Abnormal; Notable for the following components:      Result Value   RBC 3.03 (*)  Hemoglobin 8.2 (*)    HCT 27.2 (*)    RDW 22.8 (*)    Platelets 410 (*)    All other components within normal limits  COMPREHENSIVE METABOLIC PANEL - Abnormal; Notable for the following components:   Sodium 134 (*)    Potassium 3.4 (*)    Chloride 97 (*)    Albumin 2.1 (*)    All other components within normal limits  I-STAT CHEM 8, ED - Abnormal; Notable for the following components:   Chloride 95 (*)    BUN 5 (*)    Hemoglobin 9.5 (*)    HCT 28.0 (*)    All other components within normal limits  RESPIRATORY PANEL BY RT PCR (FLU A&B, COVID)  PROTIME-INR  APTT  DIFFERENTIAL  LIPASE, BLOOD  HIV ANTIBODY (ROUTINE TESTING W REFLEX)  RAPID URINE DRUG SCREEN,  HOSP PERFORMED  COMPREHENSIVE METABOLIC PANEL  TSH  VITAMIN B12  RETICULOCYTES  FERRITIN  IRON AND TIBC  CBC WITH DIFFERENTIAL/PLATELET  MAGNESIUM  PHOSPHORUS  CBG MONITORING, ED  CBG MONITORING, ED  I-STAT BETA HCG BLOOD, ED ([facility], [facility], [facility] ONLY)    EKG EKG Interpretation  Date/Time:  Saturday April 15 2020 13:27:36 EDT Ventricular Rate:  114 PR Interval:  128 QRS Duration: 70 QT Interval:  316 QTC Calculation: 435 R Axis:   68 Text Interpretation: Sinus tachycardia Otherwise normal ECG Confirmed by Quintella Reichert [phone]) on 04/15/2020 2:38:17 PM   Radiology CT HEAD WO CONTRAST  Result Date: 04/15/2020 CLINICAL DATA:  Left-sided weakness EXAM: CT HEAD WITHOUT CONTRAST TECHNIQUE: Contiguous axial images were obtained from the base of the skull through the vertex without intravenous contrast. COMPARISON:  None. FINDINGS: Brain: No evidence of acute infarction, hemorrhage, hydrocephalus, extra-axial collection or mass lesion/mass effect. Vascular: No hyperdense vessel or unexpected calcification. Skull: Normal. Negative for fracture or focal lesion. Sinuses/Orbits: Mucosal thickening within the bilateral maxillary sinuses, left worse than right. Remaining paranasal sinuses and mastoid air cells are clear. Orbital structures unremarkable. Other: None. IMPRESSION: 1. No acute intracranial findings. 2. Bilateral maxillary sinus disease. Electronically Signed   By: Davina Poke D.O.   On: 04/15/2020 14:18   CT Angio Chest PE W/Cm &/Or Wo Cm  Result Date: 04/15/2020 CLINICAL DATA:  Failure to thrive, left-sided weakness, left upper quadrant pain EXAM: CT ANGIOGRAPHY CHEST CT ABDOMEN AND PELVIS WITH CONTRAST TECHNIQUE: Multidetector CT imaging of the chest was performed using the standard protocol during bolus administration of intravenous contrast. Multiplanar CT image reconstructions and MIPs were obtained to evaluate the vascular anatomy. Multidetector CT imaging of the abdomen and  pelvis was performed using the standard protocol during bolus administration of intravenous contrast. CONTRAST:  1102mL OMNIPAQUE IOHEXOL 350 MG/ML SOLN COMPARISON:  None. FINDINGS: CTA CHEST FINDINGS Cardiovascular: Satisfactory opacification of the pulmonary arteries to the segmental level. No evidence of pulmonary embolism. Thoracic aorta is normal in course and caliber. Four vessel arch, an anatomic variant. Normal heart size. No pericardial effusion. Mediastinum/Nodes: No enlarged mediastinal, hilar, or axillary lymph nodes. Thyroid gland, trachea, and esophagus demonstrate no significant findings. Lungs/Pleura: Lungs are clear. No pleural effusion or pneumothorax. Musculoskeletal: Mild dextrocurvature of the thoracic spine. No acute osseous abnormality. No chest wall abnormality. Review of the MIP images confirms the above findings. CT ABDOMEN and PELVIS FINDINGS Hepatobiliary: No focal liver abnormality is seen. No gallstones, gallbladder wall thickening, or biliary dilatation. Pancreas: No definite pancreatic parenchymal edema. There is fluid within the pancreaticoduodenal groove. No pancreatic ductal dilatation. Spleen: Normal  in size without focal abnormality. Adrenals/Urinary Tract: Unremarkable adrenal glands. Kidneys enhance symmetrically without focal lesion, stone, or hydronephrosis. Ureters are nondilated. Urinary bladder appears unremarkable. Stomach/Bowel: Small sliding type hiatal hernia. Mildly thickened rugal folds within the distal stomach with mucosal hyperenhancement. The proximal duodenum is also thickened with suspected non perforated ulceration along the posterolateral wall of the duodenal bulb (series 3, image 30; series 7, image 54) with prominent fluid within the pancreaticoduodenal groove. Appendix appears unremarkable (series 6, images 34-42). No focal colonic wall thickening or inflammatory changes. Prominent stool ball within the rectum measuring 5.8 cm in diameter.  Vascular/Lymphatic: No acute vascular abnormality. There are a few mildly prominent pancreaticoduodenal lymph nodes, likely reactive. No abdominopelvic lymphadenopathy. Reproductive: Anteverted uterus with probable nabothian cysts. Physiologic cyst or follicle in the region of the right ovary. Adnexal regions otherwise unremarkable. Other: No organized abdominopelvic fluid collection. No pneumoperitoneum. Musculoskeletal: Degenerative changes of the bilateral SI joints. No acute osseous findings. Review of the MIP images confirms the above findings. IMPRESSION: 1. No evidence of pulmonary embolism or acute cardiopulmonary disease. 2. Thickened proximal duodenum with suspected non-perforated ulceration along the posterolateral wall of the duodenal bulb with prominent fluid within the pancreaticoduodenal groove. Findings are concerning for duodenitis/peptic ulcer disease. Consider endoscopic evaluation. 3. Groove pancreatitis with secondary duodenitis, although felt to be less likely, is not excluded. Correlation with serum pancreatic enzymes is recommended. 4. Prominent stool ball within the rectum measuring 5.8 cm in diameter. Correlate for constipation. Electronically Signed   By: Davina Poke D.O.   On: 04/15/2020 16:38   CT Abdomen Pelvis W Contrast  Result Date: 04/15/2020 CLINICAL DATA:  Failure to thrive, left-sided weakness, left upper quadrant pain EXAM: CT ANGIOGRAPHY CHEST CT ABDOMEN AND PELVIS WITH CONTRAST TECHNIQUE: Multidetector CT imaging of the chest was performed using the standard protocol during bolus administration of intravenous contrast. Multiplanar CT image reconstructions and MIPs were obtained to evaluate the vascular anatomy. Multidetector CT imaging of the abdomen and pelvis was performed using the standard protocol during bolus administration of intravenous contrast. CONTRAST:  175mL OMNIPAQUE IOHEXOL 350 MG/ML SOLN COMPARISON:  None. FINDINGS: CTA CHEST FINDINGS Cardiovascular:  Satisfactory opacification of the pulmonary arteries to the segmental level. No evidence of pulmonary embolism. Thoracic aorta is normal in course and caliber. Four vessel arch, an anatomic variant. Normal heart size. No pericardial effusion. Mediastinum/Nodes: No enlarged mediastinal, hilar, or axillary lymph nodes. Thyroid gland, trachea, and esophagus demonstrate no significant findings. Lungs/Pleura: Lungs are clear. No pleural effusion or pneumothorax. Musculoskeletal: Mild dextrocurvature of the thoracic spine. No acute osseous abnormality. No chest wall abnormality. Review of the MIP images confirms the above findings. CT ABDOMEN and PELVIS FINDINGS Hepatobiliary: No focal liver abnormality is seen. No gallstones, gallbladder wall thickening, or biliary dilatation. Pancreas: No definite pancreatic parenchymal edema. There is fluid within the pancreaticoduodenal groove. No pancreatic ductal dilatation. Spleen: Normal in size without focal abnormality. Adrenals/Urinary Tract: Unremarkable adrenal glands. Kidneys enhance symmetrically without focal lesion, stone, or hydronephrosis. Ureters are nondilated. Urinary bladder appears unremarkable. Stomach/Bowel: Small sliding type hiatal hernia. Mildly thickened rugal folds within the distal stomach with mucosal hyperenhancement. The proximal duodenum is also thickened with suspected non perforated ulceration along the posterolateral wall of the duodenal bulb (series 3, image 30; series 7, image 54) with prominent fluid within the pancreaticoduodenal groove. Appendix appears unremarkable (series 6, images 34-42). No focal colonic wall thickening or inflammatory changes. Prominent stool ball within the rectum measuring 5.8 cm in diameter. Vascular/Lymphatic:  No acute vascular abnormality. There are a few mildly prominent pancreaticoduodenal lymph nodes, likely reactive. No abdominopelvic lymphadenopathy. Reproductive: Anteverted uterus with probable nabothian cysts.  Physiologic cyst or follicle in the region of the right ovary. Adnexal regions otherwise unremarkable. Other: No organized abdominopelvic fluid collection. No pneumoperitoneum. Musculoskeletal: Degenerative changes of the bilateral SI joints. No acute osseous findings. Review of the MIP images confirms the above findings. IMPRESSION: 1. No evidence of pulmonary embolism or acute cardiopulmonary disease. 2. Thickened proximal duodenum with suspected non-perforated ulceration along the posterolateral wall of the duodenal bulb with prominent fluid within the pancreaticoduodenal groove. Findings are concerning for duodenitis/peptic ulcer disease. Consider endoscopic evaluation. 3. Groove pancreatitis with secondary duodenitis, although felt to be less likely, is not excluded. Correlation with serum pancreatic enzymes is recommended. 4. Prominent stool ball within the rectum measuring 5.8 cm in diameter. Correlate for constipation. Electronically Signed   By: Davina Poke D.O.   On: 04/15/2020 16:38    Procedures Procedures (including critical care time)  Medications Ordered in ED Medications  sodium chloride flush (NS) 0.9 % injection 3 mL (has no administration in time range)  LORazepam (ATIVAN) tablet 1-4 mg (has no administration in time range)    Or  LORazepam (ATIVAN) injection 1-4 mg (has no administration in time range)  thiamine tablet 100 mg (has no administration in time range)    Or  thiamine (B-1) injection 100 mg (has no administration in time range)  folic acid (FOLVITE) tablet 1 mg (has no administration in time range)  multivitamin with minerals tablet 1 tablet (has no administration in time range)  pantoprazole (PROTONIX) injection 40 mg (has no administration in time range)  lactated ringers with KCl 20 mEq/L infusion (has no administration in time range)  sodium chloride 0.9 % bolus 500 mL (0 mLs Intravenous Stopped 04/15/20 1603)  iohexol (OMNIPAQUE) 350 MG/ML injection 100 mL  (100 mLs Intravenous Contrast Given 04/15/20 1545)  pantoprazole (PROTONIX) injection 40 mg (40 mg Intravenous Given 04/15/20 1844)    ED Course  I have reviewed the triage vital signs and the nursing notes.  Pertinent labs & imaging results that were available during my care of the patient were reviewed by me and considered in my medical decision making (see chart for details).    MDM Rules/Calculators/A&P                         patient here for evaluation of abdominal pain as well as progressive weakness to the left arm and leg. She is chronically ill appearing on evaluation with abdominal tenderness. CT head is negative for acute abnormality. CT abdomen pelvis is concerning for gastritis, possible pancreatitis. Given her multiple issues will treat with PPI, IV fluids and admit for ongoing workup for her left-sided weakness as well. Medicine consulted for admission.  Final Clinical Impression(s) / ED Diagnoses Final diagnoses:  Acute gastritis without hemorrhage, unspecified gastritis type  Weakness    Rx / DC Orders ED Discharge Orders    None       Quintella Reichert, MD 04/15/20 2139

## 2020-04-15 NOTE — H&P (Signed)
Date: 04/15/2020               Patient Name:  Gabrielle Singh MRN: [id number]  DOB: Dec 28, 1985 Age / Sex: 34 y.o., female   PCP: Patient, No Pcp Per         Medical Service: Internal Medicine Teaching Service         Attending Physician: Dr. Lucious Groves, DO    First Contact: Dr. Virl Axe, MD Pager: [phone]  Second Contact: Dr. Madalyn Rob, MD Pager: [phone]       After Hours (After 5p/  First Contact Pager: [phone]  weekends / holidays): Second Contact Pager: [phone]   Chief Complaint:   History of Present Illness: Patient reports pain on her left side. It was extreme today. She was told she has ulcers on her left side. She reports she hasnt slept for 3 days.   34 year old patient with no pertinent past medical history presenting with left sided pain and weakness for the past 3 days. She is nervous and anxious throughout the encounter and at times is tearful. She reports that the pain and weakness has been waxing and waning, but is typically a constant, sharp pain that climbs up her LLE and up her ribcage. The pain does not radiate anywhere and nothing makes it worse. She states that lying on her right side helps relieve the pain at time. She has not tried taking any medications for it. She does report that the pain went away after consuming contrast dye. She also reports loss of appetite for the past day and has had some recent weight loss. She reports that she has a history of anemia that is chronic.   She states that she does not go to the doctor as she has generally been healthy until now. However, she reports that she did have a virtual visit with a doctor who told her that she has rheumatoid arthritis, but she never followed up with that doctor.  She is married and has two children. She usually moves around with a walker, but has not been able to during the past three days. She denies any family medical history.   Spoke via phone call with her husband, he reports  that she has had acid reflux for the past year and does drink alcohol daily (amount varies; "she does not get drunk everyday but she does drink a good bit everyday"). As per husband, patient consumes baking soda regularly. He also reports that she has not been eating food for the past couple of days.  In the ED, CBC showing Hgb 8.2, CMP showing sodium 134 / potassium 3.4 / albumin 2.1. Lipase 34. CT head showing no acute intracranial abnormalities. CTA Chest/Abd/Pelvis showing thickened proximal duodenum with suspected non-perforated ulceration along posterolateral wall of duodenal bulb w/ prominent fluid within the pancreaticoduodenal groove, concerning for duodenitis vs PUD. Admitted to IMTS for further evaluation and management.   Meds:  No outpatient medications have been marked as taking for the 04/15/20 encounter [facility] Encounter).     Allergies: Allergies as of 04/15/2020  . (No Known Allergies)   History reviewed. No pertinent past medical history.  Family History:  No pertinent family history  Social History:  Social History   Socioeconomic History  . Marital status: Married    Spouse name: Not on file  . Number of children: Not on file  . Years of education: Not on file  . Highest education level: Not on file  Occupational  History  . Not on file  Tobacco Use  . Smoking status: Never Smoker  . Smokeless tobacco: Never Used  Substance and Sexual Activity  . Alcohol use: Not Currently  . Drug use: Not Currently  . Sexual activity: Not on file  Other Topics Concern  . Not on file  Social History Narrative  . Not on file   Social Determinants of Health   Financial Resource Strain:   . Difficulty of Paying Living Expenses: Not on file  Food Insecurity:   . Worried About Charity fundraiser in the Last Year: Not on file  . Ran Out of Food in the Last Year: Not on file  Transportation Needs:   . Lack of Transportation (Medical): Not on file  . Lack of  Transportation (Non-Medical): Not on file  Physical Activity:   . Days of Exercise per Week: Not on file  . Minutes of Exercise per Session: Not on file  Stress:   . Feeling of Stress : Not on file  Social Connections:   . Frequency of Communication with Friends and Family: Not on file  . Frequency of Social Gatherings with Friends and Family: Not on file  . Attends Religious Services: Not on file  . Active Member of Clubs or Organizations: Not on file  . Attends Archivist Meetings: Not on file  . Marital Status: Not on file  Intimate Partner Violence:   . Fear of Current or Ex-Partner: Not on file  . Emotionally Abused: Not on file  . Physically Abused: Not on file  . Sexually Abused: Not on file     Review of Systems: A complete ROS was negative except as per HPI.   (+) for left sided pain and weakness  Physical Exam: Blood pressure 119/79, pulse 95, temperature 97.9 F (36.6 C), temperature source Oral, resp. rate 18, SpO2 100 %. Physical Exam Constitutional:      General: She is not in acute distress.    Appearance: She is well-developed.  HENT:     Head: Normocephalic and atraumatic.  Eyes:     Extraocular Movements: Extraocular movements intact.     Pupils: Pupils are equal, round, and reactive to light.  Cardiovascular:     Rate and Rhythm: Regular rhythm. Tachycardia present.     Heart sounds: Normal heart sounds. No murmur heard.  No friction rub. No gallop.   Pulmonary:     Effort: Pulmonary effort is normal. No respiratory distress.     Breath sounds: Normal breath sounds. No wheezing, rhonchi or rales.  Abdominal:     General: Bowel sounds are normal. There is no distension.     Palpations: Abdomen is soft. There is no mass.     Tenderness: There is no abdominal tenderness. There is no guarding or rebound.  Skin:    General: Skin is warm and dry.     Capillary Refill: Capillary refill takes less than 2 seconds.     Coloration: Skin is not  jaundiced.     Findings: No rash.  Neurological:     General: No focal deficit present.     Mental Status: She is alert and oriented to person, place, and time.     Comments: 4/5 strength in RUE and RLE. 0/5 strength in LUE and LLE. Sensation intact in bilateral lower extremities and RUE. Sensation absent distal to the wrist in LUE, but present proximal to wrist.  Psychiatric:     Comments: Patient is very anxious,  tearful, and scared. Her mood is depressed. She appears to be emotionally labile.     EKG: sinus tachycardia   CT HEAD WO CONTRAST  Result Date: 04/15/2020 CLINICAL DATA:  Left-sided weakness EXAM: CT HEAD WITHOUT CONTRAST TECHNIQUE: Contiguous axial images were obtained from the base of the skull through the vertex without intravenous contrast. COMPARISON:  None. FINDINGS: Brain: No evidence of acute infarction, hemorrhage, hydrocephalus, extra-axial collection or mass lesion/mass effect. Vascular: No hyperdense vessel or unexpected calcification. Skull: Normal. Negative for fracture or focal lesion. Sinuses/Orbits: Mucosal thickening within the bilateral maxillary sinuses, left worse than right. Remaining paranasal sinuses and mastoid air cells are clear. Orbital structures unremarkable. Other: None. IMPRESSION: 1. No acute intracranial findings. 2. Bilateral maxillary sinus disease. Electronically Signed   By: Davina Poke D.O.   On: 04/15/2020 14:18   CT Angio Chest PE W/Cm &/Or Wo Cm  Result Date: 04/15/2020 CLINICAL DATA:  Failure to thrive, left-sided weakness, left upper quadrant pain EXAM: CT ANGIOGRAPHY CHEST CT ABDOMEN AND PELVIS WITH CONTRAST TECHNIQUE: Multidetector CT imaging of the chest was performed using the standard protocol during bolus administration of intravenous contrast. Multiplanar CT image reconstructions and MIPs were obtained to evaluate the vascular anatomy. Multidetector CT imaging of the abdomen and pelvis was performed using the standard protocol  during bolus administration of intravenous contrast. CONTRAST:  169m OMNIPAQUE IOHEXOL 350 MG/ML SOLN COMPARISON:  None. FINDINGS: CTA CHEST FINDINGS Cardiovascular: Satisfactory opacification of the pulmonary arteries to the segmental level. No evidence of pulmonary embolism. Thoracic aorta is normal in course and caliber. Four vessel arch, an anatomic variant. Normal heart size. No pericardial effusion. Mediastinum/Nodes: No enlarged mediastinal, hilar, or axillary lymph nodes. Thyroid gland, trachea, and esophagus demonstrate no significant findings. Lungs/Pleura: Lungs are clear. No pleural effusion or pneumothorax. Musculoskeletal: Mild dextrocurvature of the thoracic spine. No acute osseous abnormality. No chest wall abnormality. Review of the MIP images confirms the above findings. CT ABDOMEN and PELVIS FINDINGS Hepatobiliary: No focal liver abnormality is seen. No gallstones, gallbladder wall thickening, or biliary dilatation. Pancreas: No definite pancreatic parenchymal edema. There is fluid within the pancreaticoduodenal groove. No pancreatic ductal dilatation. Spleen: Normal in size without focal abnormality. Adrenals/Urinary Tract: Unremarkable adrenal glands. Kidneys enhance symmetrically without focal lesion, stone, or hydronephrosis. Ureters are nondilated. Urinary bladder appears unremarkable. Stomach/Bowel: Small sliding type hiatal hernia. Mildly thickened rugal folds within the distal stomach with mucosal hyperenhancement. The proximal duodenum is also thickened with suspected non perforated ulceration along the posterolateral wall of the duodenal bulb (series 3, image 30; series 7, image 54) with prominent fluid within the pancreaticoduodenal groove. Appendix appears unremarkable (series 6, images 34-42). No focal colonic wall thickening or inflammatory changes. Prominent stool ball within the rectum measuring 5.8 cm in diameter. Vascular/Lymphatic: No acute vascular abnormality. There are a few  mildly prominent pancreaticoduodenal lymph nodes, likely reactive. No abdominopelvic lymphadenopathy. Reproductive: Anteverted uterus with probable nabothian cysts. Physiologic cyst or follicle in the region of the right ovary. Adnexal regions otherwise unremarkable. Other: No organized abdominopelvic fluid collection. No pneumoperitoneum. Musculoskeletal: Degenerative changes of the bilateral SI joints. No acute osseous findings. Review of the MIP images confirms the above findings. IMPRESSION: 1. No evidence of pulmonary embolism or acute cardiopulmonary disease. 2. Thickened proximal duodenum with suspected non-perforated ulceration along the posterolateral wall of the duodenal bulb with prominent fluid within the pancreaticoduodenal groove. Findings are concerning for duodenitis/peptic ulcer disease. Consider endoscopic evaluation. 3. Groove pancreatitis with secondary duodenitis, although felt to be  less likely, is not excluded. Correlation with serum pancreatic enzymes is recommended. 4. Prominent stool ball within the rectum measuring 5.8 cm in diameter. Correlate for constipation. Electronically Signed   By: Davina Poke D.O.   On: 04/15/2020 16:38   CT Abdomen Pelvis W Contrast  Result Date: 04/15/2020 CLINICAL DATA:  Failure to thrive, left-sided weakness, left upper quadrant pain EXAM: CT ANGIOGRAPHY CHEST CT ABDOMEN AND PELVIS WITH CONTRAST TECHNIQUE: Multidetector CT imaging of the chest was performed using the standard protocol during bolus administration of intravenous contrast. Multiplanar CT image reconstructions and MIPs were obtained to evaluate the vascular anatomy. Multidetector CT imaging of the abdomen and pelvis was performed using the standard protocol during bolus administration of intravenous contrast. CONTRAST:  147m OMNIPAQUE IOHEXOL 350 MG/ML SOLN COMPARISON:  None. FINDINGS: CTA CHEST FINDINGS Cardiovascular: Satisfactory opacification of the pulmonary arteries to the  segmental level. No evidence of pulmonary embolism. Thoracic aorta is normal in course and caliber. Four vessel arch, an anatomic variant. Normal heart size. No pericardial effusion. Mediastinum/Nodes: No enlarged mediastinal, hilar, or axillary lymph nodes. Thyroid gland, trachea, and esophagus demonstrate no significant findings. Lungs/Pleura: Lungs are clear. No pleural effusion or pneumothorax. Musculoskeletal: Mild dextrocurvature of the thoracic spine. No acute osseous abnormality. No chest wall abnormality. Review of the MIP images confirms the above findings. CT ABDOMEN and PELVIS FINDINGS Hepatobiliary: No focal liver abnormality is seen. No gallstones, gallbladder wall thickening, or biliary dilatation. Pancreas: No definite pancreatic parenchymal edema. There is fluid within the pancreaticoduodenal groove. No pancreatic ductal dilatation. Spleen: Normal in size without focal abnormality. Adrenals/Urinary Tract: Unremarkable adrenal glands. Kidneys enhance symmetrically without focal lesion, stone, or hydronephrosis. Ureters are nondilated. Urinary bladder appears unremarkable. Stomach/Bowel: Small sliding type hiatal hernia. Mildly thickened rugal folds within the distal stomach with mucosal hyperenhancement. The proximal duodenum is also thickened with suspected non perforated ulceration along the posterolateral wall of the duodenal bulb (series 3, image 30; series 7, image 54) with prominent fluid within the pancreaticoduodenal groove. Appendix appears unremarkable (series 6, images 34-42). No focal colonic wall thickening or inflammatory changes. Prominent stool ball within the rectum measuring 5.8 cm in diameter. Vascular/Lymphatic: No acute vascular abnormality. There are a few mildly prominent pancreaticoduodenal lymph nodes, likely reactive. No abdominopelvic lymphadenopathy. Reproductive: Anteverted uterus with probable nabothian cysts. Physiologic cyst or follicle in the region of the right  ovary. Adnexal regions otherwise unremarkable. Other: No organized abdominopelvic fluid collection. No pneumoperitoneum. Musculoskeletal: Degenerative changes of the bilateral SI joints. No acute osseous findings. Review of the MIP images confirms the above findings. IMPRESSION: 1. No evidence of pulmonary embolism or acute cardiopulmonary disease. 2. Thickened proximal duodenum with suspected non-perforated ulceration along the posterolateral wall of the duodenal bulb with prominent fluid within the pancreaticoduodenal groove. Findings are concerning for duodenitis/peptic ulcer disease. Consider endoscopic evaluation. 3. Groove pancreatitis with secondary duodenitis, although felt to be less likely, is not excluded. Correlation with serum pancreatic enzymes is recommended. 4. Prominent stool ball within the rectum measuring 5.8 cm in diameter. Correlate for constipation. Electronically Signed   By: NDavina PokeD.O.   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 Lipase level 34. Negative CT head. CTA chest/abdomen/pelvis showing thickened proximal duodenum with suspected  non-perforated ulceration along the posterolateral wall of the duodenal bulb with prominent fluid within the pancreaticoduodenal groove, concerning for groove pancreatitis with secondary duodenitis vs PUD. Patient appears to have an underlying psychiatric illness that is likely contributing to her chronic alcohol use and to her current presentation.  -After reviewing some literature, groove pancreatitis has an unclear etiology, but the strongest association is thought to be with a long history of alcohol abuse. Furthermore, it appears that pancreatic enzymes are  often normal or only minimally elevated, although bilirubin levels and alk phos can be elevated with CBD obstruction. Lastly, it appears to be extremely difficult to distinguish groove pancreatitis from  Primary duodenal, ampullary, or pancreatic malignancy. - Hgb 8.2 - bilirubin and alk phos wnl - will need to consult GI for possible EGD to further evaluate - NPO status - on PPI - f/u iron panel, TSH, UDS - daily cbc, cmp - CIWA w/ativan - thiamine, folate, MVA when not NPO  Chronic Malnutrition Husband reports that patient has not been eating well for past 2 years. -albumin 2.1 -consult RD -thiamine, folate, MVA when not NPO -f/u folate and B12 levels  Anemia As per patient, this is chronic. -f/u iron panel   Dispo: Admit patient to Inpatient with expected length of stay greater than 2 midnights.  Signed: Virl Axe, MD 04/15/2020, 7:31 PM  Pager: [phone] After 5pm on weekdays and 1pm on weekends: On Call pager: [phone]

## 2020-04-15 NOTE — ED Notes (Signed)
Pt moved into ED stretcher by staff members. Pt reports unable to move left-side extremities and RLE. Pt reports not being able to ambulate/stand.  Intermittent X 1 month    N/V. Unable to keep food/drink down. X 1 day  Pt reports for the last month her mother carries her to the bathroom to take care of ADLs.   Pt reports hx of anemia, but states she hasn't seen a doctor in a long time.

## 2020-04-15 NOTE — ED Triage Notes (Addendum)
Pt to triage via [facility].  EMS reports pt appears to be failure to thrive.  Pt is 34 years old with a Depends on and has a shirt covered in food stains.  Family reported decreased appetite and L sided weakness.  EMS reports generalized weakness and L sided abd pain.  Pt with L arm flaccid.  L arm fell down beside her while in wheelchair and she needed assistance lifting it up.  RN and tech had to move pt up in wheelchair because she was sliding out and she was unable to help.  Pt states she intermittently has L sided weakness- states she has it 1 day and the next day she is fine.  Reports L arm weakness started today at 8am (already awake prior to that time and was normal per pt).  VAN negative.  Also reports LUQ pain.

## 2020-04-16 DIAGNOSIS — L8990 Pressure ulcer of unspecified site, unspecified stage: Secondary | ICD-10-CM | POA: Diagnosis present

## 2020-04-16 DIAGNOSIS — D500 Iron deficiency anemia secondary to blood loss (chronic): Secondary | ICD-10-CM

## 2020-04-16 DIAGNOSIS — R630 Anorexia: Secondary | ICD-10-CM

## 2020-04-16 DIAGNOSIS — R634 Abnormal weight loss: Secondary | ICD-10-CM

## 2020-04-16 LAB — COMPREHENSIVE METABOLIC PANEL
ALT: 15 U/L (ref 0–44)
AST: 24 U/L (ref 15–41)
Albumin: 1.7 g/dL — ABNORMAL LOW (ref 3.5–5.0)
Alkaline Phosphatase: 70 U/L (ref 38–126)
Anion gap: 8 (ref 5–15)
BUN: <5 mg/dL — ABNORMAL LOW (ref 6–20)
CO2: 25 mmol/L (ref 22–32)
Calcium: 8.5 mg/dL — ABNORMAL LOW (ref 8.9–10.3)
Chloride: 103 mmol/L (ref 98–111)
Creatinine, Ser: 0.56 mg/dL (ref 0.44–1.00)
GFR, Estimated: >60 mL/min (ref >60)
Glucose, Bld: 67 mg/dL — ABNORMAL LOW (ref 70–99)
Potassium: 3.8 mmol/L (ref 3.5–5.1)
Sodium: 136 mmol/L (ref 135–145)
Total Bilirubin: 0.8 mg/dL (ref 0.3–1.2)
Total Protein: 6.2 g/dL — ABNORMAL LOW (ref 6.5–8.1)

## 2020-04-16 LAB — CBC WITH DIFFERENTIAL/PLATELET
Abs Immature Granulocytes: 0.01 10*3/uL (ref 0.00–0.07)
Basophils Absolute: 0 10*3/uL (ref 0.0–0.1)
Basophils Relative: 1 %
Eosinophils Absolute: 0.1 10*3/uL (ref 0.0–0.5)
Eosinophils Relative: 1 %
HCT: 25.9 % — ABNORMAL LOW (ref 36.0–46.0)
Hemoglobin: 7.9 g/dL — ABNORMAL LOW (ref 12.0–15.0)
Immature Granulocytes: 0 %
Lymphocytes Relative: 45 %
Lymphs Abs: 2.4 10*3/uL (ref 0.7–4.0)
MCH: 27.3 pg (ref 26.0–34.0)
MCHC: 30.5 g/dL (ref 30.0–36.0)
MCV: 89.6 fL (ref 80.0–100.0)
Monocytes Absolute: 0.4 10*3/uL (ref 0.1–1.0)
Monocytes Relative: 7 %
Neutro Abs: 2.4 10*3/uL (ref 1.7–7.7)
Neutrophils Relative %: 46 %
Platelets: 312 10*3/uL (ref 150–400)
RBC: 2.89 MIL/uL — ABNORMAL LOW (ref 3.87–5.11)
RDW: 22.6 % — ABNORMAL HIGH (ref 11.5–15.5)
WBC: 5.3 10*3/uL (ref 4.0–10.5)
nRBC: 0 % (ref 0.0–0.2)

## 2020-04-16 LAB — MAGNESIUM: Magnesium: 1.5 mg/dL — ABNORMAL LOW (ref 1.7–2.4)

## 2020-04-16 LAB — FOLATE: Folate: 4.9 ng/mL — ABNORMAL LOW (ref >5.9)

## 2020-04-16 LAB — PHOSPHORUS: Phosphorus: 4.3 mg/dL (ref 2.5–4.6)

## 2020-04-16 MED ORDER — SODIUM CHLORIDE 0.9 % IV SOLN
510.0000 mg | Freq: Once | INTRAVENOUS | Status: AC
  Administered 2020-04-16: 510 mg via INTRAVENOUS
  Filled 2020-04-16: qty 17

## 2020-04-16 MED ORDER — LACTATED RINGERS IV SOLN: INTRAVENOUS | Status: DC

## 2020-04-16 MED ORDER — POLYETHYLENE GLYCOL 3350 17 G PO PACK
17.0000 g | PACK | Freq: Every day | ORAL | Status: DC
  Administered 2020-04-16 – 2020-04-18 (×3): 17 g via ORAL
  Filled 2020-04-16 (×5): qty 1

## 2020-04-16 MED ORDER — MAGNESIUM SULFATE 2 GM/50ML IV SOLN
2.0000 g | Freq: Once | INTRAVENOUS | Status: AC
  Administered 2020-04-16: 2 g via INTRAVENOUS
  Filled 2020-04-16: qty 50

## 2020-04-16 NOTE — Consult Note (Addendum)
Referring Provider: Dr. Virl Axe Primary Care Physician:  Patient, No Pcp Per Primary Gastroenterologist: Althia Forts   Reason for Consultation:  Pancreatitis/duodenitis   HPI: Gabrielle Singh is a 34 y.o. female significant past medical history of depression, anemia  No surgical history.  She developed left upper abdominal pain which started 4 days ago. She ate spicy [place] chips with chili and drank coca cola on Friday 04/14/2020 and her left upper abdominal pain worsened and spread to her left flank area. She had nausea and dry heaves without vomiting. She stated her abdominal pain was severe and her family called 38.  She was transported to [facility] ED 04/15/2020 for further evaluation.  At the EMS team reported she appeared soiled wearing a depends and a shirt covered in food stains.  She also reported having left upper extremity weakness as well as lower extremity weakness for which she is using a walker and wheelchair at home for the past month.  Labs in the ED showed WBC 5.8.  Hemoglobin 8.2.  CMP was normal.  Lipase 34.  A chest CT angiogram was negative for pulmonary embolism.  An abdominal/pelvic CT with contrast showed a thickened proximal duodenum with suspected nonperforated ulceration along the posterior wall of the duodenal bulb with prominent fluid within the pancreatic duodenal groove.  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  A stool ball measuring 5.8 cm was noted in the rectum.  A GI consult was requested for endoscopic consideration and pancreatitis management.  She reports having left upper abdominal/flank pain which started 4 days ago as noted above.  She has dry heaves without vomiting.  No heartburn or dysphagia.  No prior history of GERD or peptic ulcer disease.  No history of GI bleed.  She denies NSAID use.  She drinks 2-4 shooters of alcohol twice weekly or less.  She denies binge alcohol drinking or  alcoholism. No prior history of pancreatitis. No drug use.  She is a non-smoker.  She has chronic constipation.  She passes a brown formed bowel movement twice weekly.  No rectal bleeding or melena.  She does not eat on a regular basis.  She often skips 1 or 2 days without eating any food which she attributes to having a decreased appetite.  She stated her 2 children and husband are well fed and the family has appropriate supply of food at home.  She has a history of bulimia from the ages of 67 through 2 then anorexic tendencies since then.  No recent purging.  Nuys ever being hospitalized for an eating disorder.  She had anemia during her pregnancies.  Her menstrual cycles are regular and fairly light in flow.  She is currently on her menstrual cycle.  She reported having left arm weakness which is progressively worsened over the past 6 months.  She reports her leg weakness started 1 month ago and she has noticed her left leg is weaker than her right.  She is using a walker at home and uses a wheelchair if she leaves her house.  She denies seeing any healthcare provider regarding her left upper extremity and lower extremity weakness.  She has lost weight over the past year, she does not know how much weight she has lost. No family history of esophageal, gastric or colon cancer. No family at the bedside.  ED course: Sodium 134.  Potassium 3.4.  Glucose 85.  BUN 6.  Creatinine 0.62.  Calcium 8.9.  Anion  gap 10.  Alk phos 82.  Albumin 2.1.  Lipase 34.  AST 32.  ALT 17.  Total bili 0.7.  WBC 5.8.  Hemoglobin 8.2.  Hematocrit 27.2.  MCV 89.8.  Platelet 410.  INR 1.1.  SARS coronavirus 2 negative.  Influenza A and B negative.   Chest CT angiogram: Abdominal/pelvic CT with contrast: 1. No evidence of pulmonary embolism or acute cardiopulmonary disease. 2. Thickened proximal duodenum with suspected non-perforated ulceration along the posterolateral wall of the duodenal bulb with prominent fluid within the  pancreaticoduodenal groove. Findings are concerning for duodenitis/peptic ulcer disease. Consider endoscopic evaluation. 3. Groove pancreatitis with secondary duodenitis, although felt to be less likely, is not excluded. Correlation with serum pancreatic enzymes is recommended. 4. Prominent stool ball within the rectum measuring 5.8 cm in diameter. Correlate for constipation.   Prior to Admission medications   Not on File    Current Facility-Administered Medications  Medication Dose Route Frequency Provider Last Rate Last Admin  . folic acid (FOLVITE) tablet 1 mg  1 mg Oral Daily Madalyn Rob, MD   1 mg at 04/16/20 5809  . LORazepam (ATIVAN) tablet 1-4 mg  1-4 mg Oral Q1H PRN Madalyn Rob, MD       Or  . LORazepam (ATIVAN) injection 1-4 mg  1-4 mg Intravenous Q1H PRN Madalyn Rob, MD      . multivitamin with minerals tablet 1 tablet  1 tablet Oral Daily Madalyn Rob, MD   1 tablet at 04/16/20 [phone]  . pantoprazole (PROTONIX) injection 40 mg  40 mg Intravenous Q12H Madalyn Rob, MD   40 mg at 04/16/20 0827  . sodium chloride flush (NS) 0.9 % injection 3 mL  3 mL Intravenous Once Madalyn Rob, MD      . thiamine tablet 100 mg  100 mg Oral Daily Madalyn Rob, MD   100 mg at 04/15/20 2305   Or  . thiamine (B-1) injection 100 mg  100 mg Intravenous Daily Madalyn Rob, MD   100 mg at 04/16/20 8250    Allergies as of 04/15/2020  . (No Known Allergies)    No family history on file.  Social History   Socioeconomic History  . Marital status: Married    Spouse name: Not on file  . Number of children: Not on file  . Years of education: Not on file  . Highest education level: Not on file  Occupational History  . Not on file  Tobacco Use  . Smoking status: Never Smoker  . Smokeless tobacco: Never Used  Substance and Sexual Activity  . Alcohol use: Not Currently  . Drug use: Not Currently  . Sexual activity: Not on file  Other Topics Concern  . Not on file  Social History  Narrative  . Not on file   Social Determinants of Health   Financial Resource Strain:   . Difficulty of Paying Living Expenses: Not on file  Food Insecurity:   . Worried About Charity fundraiser in the Last Year: Not on file  . Ran Out of Food in the Last Year: Not on file  Transportation Needs:   . Lack of Transportation (Medical): Not on file  . Lack of Transportation (Non-Medical): Not on file  Physical Activity:   . Days of Exercise per Week: Not on file  . Minutes of Exercise per Session: Not on file  Stress:   . Feeling of Stress : Not on file  Social Connections:   . Frequency of Communication with  Friends and Family: Not on file  . Frequency of Social Gatherings with Friends and Family: Not on file  . Attends Religious Services: Not on file  . Active Member of Clubs or Organizations: Not on file  . Attends Archivist Meetings: Not on file  . Marital Status: Not on file  Intimate Partner Violence:   . Fear of Current or Ex-Partner: Not on file  . Emotionally Abused: Not on file  . Physically Abused: Not on file  . Sexually Abused: Not on file    Review of Systems: Gen: See HPI. No fevers. CV: Denies chest pain, palpitations or edema. Resp: Denies cough, shortness of breath of hemoptysis.  GI: See HPI. GU : Denies urinary burning, blood in urine, increased urinary frequency or incontinence. MS: See HPI. Derm: Denies rash, itchiness, skin lesions or unhealing ulcers. Psych: + Depression.  Heme: Denies easy bruising, bleeding. Neuro:  See HPI. Denies headaches or dizziness.  Endo:  Denies any problems with DM, thyroid or adrenal function.  Physical Exam: Vital signs in last 24 hours: Temp:  [97.9 F (36.6 C)-98.9 F (37.2 C)] 98.2 F (36.8 C) (10/24 0847) Pulse Rate:  [81-114] 101 (10/24 0847) Resp:  [13-20] 17 (10/24 0847) BP: (106-130)/(72-101) 106/72 (10/24 0847) SpO2:  [62 %-100 %] 100 % (10/24 0847) Last BM Date: 04/15/20 General:  Alert,   well-developed, well-nourished, pleasant and cooperative in NAD. Head:  Normocephalic and atraumatic. Eyes:  No scleral icterus. Conjunctiva pink. Ears:  Normal auditory acuity. Nose:  No deformity, discharge or lesions. Mouth:  Dentition intact. No ulcers or lesions.  Neck:  Supple. No lymphadenopathy or thyromegaly.  Lungs: Breath sounds clear throughout.  Heart:  RRR, no murmur.  Abdomen:  Soft, nondistended. Moderate LUQ tenderness radiates down flank/left hip. No rebound or guarding. + BS x 4 quads. No HSM. Abdominal striae noted. Rectal: Small external hemorrhoids. Moderate amount of soft formed stool in rectum without impaction, stool was broken up but not removed from rectum as patient asked that I stop the exam. Stool was guaiac negative. Menstrual blood at vaginal orifice. Nurse Ren present during exam.  Musculoskeletal: Generalized weakness. See Neuro below.  Pulses:  Normal pulses noted. Extremities:  Without clubbing or edema. Neurologic:  Alert and  oriented x4. LUE is flacid. Unable to lift  LUE. LLE weaker than RLE. Skin:  Intact without significant lesions or rashes. Psych:  Alert and cooperative. Normal mood and affect.  Intake/Output from previous day: 10/23 0701 - 10/24 0700 In: 525 [IV Piggyback:525] Out: -  Intake/Output this shift: No intake/output data recorded.  Lab Results: Recent Labs    04/15/20 1334 04/15/20 1342 04/16/20 0240  WBC 5.8  --  5.3  HGB 8.2* 9.5* 7.9*  HCT 27.2* 28.0* 25.9*  PLT 410*  --  312   BMET Recent Labs    04/15/20 1334 04/15/20 1342 04/16/20 0240  NA 134* 135 136  K 3.4* 3.5 3.8  CL 97* 95* 103  CO2 27  --  25  GLUCOSE 85 86 67*  BUN 6 5* <5*  CREATININE 0.62 0.50 0.56  CALCIUM 8.9  --  8.5*   LFT Recent Labs    04/16/20 0240  PROT 6.2*  ALBUMIN 1.7*  AST 24  ALT 15  ALKPHOS 70  BILITOT 0.8   PT/INR Recent Labs    04/15/20 1334  LABPROT 13.2  INR 1.1   Hepatitis Panel No results for input(s):  HEPBSAG, HCVAB, HEPAIGM, HEPBIGM in the last  72 hours.    Studies/Results: CT HEAD WO CONTRAST  Result Date: 04/15/2020 CLINICAL DATA:  Left-sided weakness EXAM: CT HEAD WITHOUT CONTRAST TECHNIQUE: Contiguous axial images were obtained from the base of the skull through the vertex without intravenous contrast. COMPARISON:  None. FINDINGS: Brain: No evidence of acute infarction, hemorrhage, hydrocephalus, extra-axial collection or mass lesion/mass effect. Vascular: No hyperdense vessel or unexpected calcification. Skull: Normal. Negative for fracture or focal lesion. Sinuses/Orbits: Mucosal thickening within the bilateral maxillary sinuses, left worse than right. Remaining paranasal sinuses and mastoid air cells are clear. Orbital structures unremarkable. Other: None. IMPRESSION: 1. No acute intracranial findings. 2. Bilateral maxillary sinus disease. Electronically Signed   By: Davina Poke D.O.   On: 04/15/2020 14:18   CT Angio Chest PE W/Cm &/Or Wo Cm  Result Date: 04/15/2020 CLINICAL DATA:  Failure to thrive, left-sided weakness, left upper quadrant pain EXAM: CT ANGIOGRAPHY CHEST CT ABDOMEN AND PELVIS WITH CONTRAST TECHNIQUE: Multidetector CT imaging of the chest was performed using the standard protocol during bolus administration of intravenous contrast. Multiplanar CT image reconstructions and MIPs were obtained to evaluate the vascular anatomy. Multidetector CT imaging of the abdomen and pelvis was performed using the standard protocol during bolus administration of intravenous contrast. CONTRAST:  172m OMNIPAQUE IOHEXOL 350 MG/ML SOLN COMPARISON:  None. FINDINGS: CTA CHEST FINDINGS Cardiovascular: Satisfactory opacification of the pulmonary arteries to the segmental level. No evidence of pulmonary embolism. Thoracic aorta is normal in course and caliber. Four vessel arch, an anatomic variant. Normal heart size. No pericardial effusion. Mediastinum/Nodes: No enlarged mediastinal, hilar, or  axillary lymph nodes. Thyroid gland, trachea, and esophagus demonstrate no significant findings. Lungs/Pleura: Lungs are clear. No pleural effusion or pneumothorax. Musculoskeletal: Mild dextrocurvature of the thoracic spine. No acute osseous abnormality. No chest wall abnormality. Review of the MIP images confirms the above findings. CT ABDOMEN and PELVIS FINDINGS Hepatobiliary: No focal liver abnormality is seen. No gallstones, gallbladder wall thickening, or biliary dilatation. Pancreas: No definite pancreatic parenchymal edema. There is fluid within the pancreaticoduodenal groove. No pancreatic ductal dilatation. Spleen: Normal in size without focal abnormality. Adrenals/Urinary Tract: Unremarkable adrenal glands. Kidneys enhance symmetrically without focal lesion, stone, or hydronephrosis. Ureters are nondilated. Urinary bladder appears unremarkable. Stomach/Bowel: Small sliding type hiatal hernia. Mildly thickened rugal folds within the distal stomach with mucosal hyperenhancement. The proximal duodenum is also thickened with suspected non perforated ulceration along the posterolateral wall of the duodenal bulb (series 3, image 30; series 7, image 54) with prominent fluid within the pancreaticoduodenal groove. Appendix appears unremarkable (series 6, images 34-42). No focal colonic wall thickening or inflammatory changes. Prominent stool ball within the rectum measuring 5.8 cm in diameter. Vascular/Lymphatic: No acute vascular abnormality. There are a few mildly prominent pancreaticoduodenal lymph nodes, likely reactive. No abdominopelvic lymphadenopathy. Reproductive: Anteverted uterus with probable nabothian cysts. Physiologic cyst or follicle in the region of the right ovary. Adnexal regions otherwise unremarkable. Other: No organized abdominopelvic fluid collection. No pneumoperitoneum. Musculoskeletal: Degenerative changes of the bilateral SI joints. No acute osseous findings. Review of the MIP images  confirms the above findings. IMPRESSION: 1. No evidence of pulmonary embolism or acute cardiopulmonary disease. 2. Thickened proximal duodenum with suspected non-perforated ulceration along the posterolateral wall of the duodenal bulb with prominent fluid within the pancreaticoduodenal groove. Findings are concerning for duodenitis/peptic ulcer disease. Consider endoscopic evaluation. 3. Groove pancreatitis with secondary duodenitis, although felt to be less likely, is not excluded. Correlation with serum pancreatic enzymes is recommended. 4. Prominent stool ball  within the rectum measuring 5.8 cm in diameter. Correlate for constipation. Electronically Signed   By: Davina Poke D.O.   On: 04/15/2020 16:38   CT Abdomen Pelvis W Contrast  Result Date: 04/15/2020 CLINICAL DATA:  Failure to thrive, left-sided weakness, left upper quadrant pain EXAM: CT ANGIOGRAPHY CHEST CT ABDOMEN AND PELVIS WITH CONTRAST TECHNIQUE: Multidetector CT imaging of the chest was performed using the standard protocol during bolus administration of intravenous contrast. Multiplanar CT image reconstructions and MIPs were obtained to evaluate the vascular anatomy. Multidetector CT imaging of the abdomen and pelvis was performed using the standard protocol during bolus administration of intravenous contrast. CONTRAST:  131m OMNIPAQUE IOHEXOL 350 MG/ML SOLN COMPARISON:  None. FINDINGS: CTA CHEST FINDINGS Cardiovascular: Satisfactory opacification of the pulmonary arteries to the segmental level. No evidence of pulmonary embolism. Thoracic aorta is normal in course and caliber. Four vessel arch, an anatomic variant. Normal heart size. No pericardial effusion. Mediastinum/Nodes: No enlarged mediastinal, hilar, or axillary lymph nodes. Thyroid gland, trachea, and esophagus demonstrate no significant findings. Lungs/Pleura: Lungs are clear. No pleural effusion or pneumothorax. Musculoskeletal: Mild dextrocurvature of the thoracic spine. No  acute osseous abnormality. No chest wall abnormality. Review of the MIP images confirms the above findings. CT ABDOMEN and PELVIS FINDINGS Hepatobiliary: No focal liver abnormality is seen. No gallstones, gallbladder wall thickening, or biliary dilatation. Pancreas: No definite pancreatic parenchymal edema. There is fluid within the pancreaticoduodenal groove. No pancreatic ductal dilatation. Spleen: Normal in size without focal abnormality. Adrenals/Urinary Tract: Unremarkable adrenal glands. Kidneys enhance symmetrically without focal lesion, stone, or hydronephrosis. Ureters are nondilated. Urinary bladder appears unremarkable. Stomach/Bowel: Small sliding type hiatal hernia. Mildly thickened rugal folds within the distal stomach with mucosal hyperenhancement. The proximal duodenum is also thickened with suspected non perforated ulceration along the posterolateral wall of the duodenal bulb (series 3, image 30; series 7, image 54) with prominent fluid within the pancreaticoduodenal groove. Appendix appears unremarkable (series 6, images 34-42). No focal colonic wall thickening or inflammatory changes. Prominent stool ball within the rectum measuring 5.8 cm in diameter. Vascular/Lymphatic: No acute vascular abnormality. There are a few mildly prominent pancreaticoduodenal lymph nodes, likely reactive. No abdominopelvic lymphadenopathy. Reproductive: Anteverted uterus with probable nabothian cysts. Physiologic cyst or follicle in the region of the right ovary. Adnexal regions otherwise unremarkable. Other: No organized abdominopelvic fluid collection. No pneumoperitoneum. Musculoskeletal: Degenerative changes of the bilateral SI joints. No acute osseous findings. Review of the MIP images confirms the above findings. IMPRESSION: 1. No evidence of pulmonary embolism or acute cardiopulmonary disease. 2. Thickened proximal duodenum with suspected non-perforated ulceration along the posterolateral wall of the duodenal  bulb with prominent fluid within the pancreaticoduodenal groove. Findings are concerning for duodenitis/peptic ulcer disease. Consider endoscopic evaluation. 3. Groove pancreatitis with secondary duodenitis, although felt to be less likely, is not excluded. Correlation with serum pancreatic enzymes is recommended. 4. Prominent stool ball within the rectum measuring 5.8 cm in diameter. Correlate for constipation. Electronically Signed   By: NDavina PokeD.O.   On: 04/15/2020 16:38    IMPRESSION/PLAN:  159  34year old female with abdominal pain.  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 -Agree with Pantoprazole 40 mg IV twice daily -IV LR @ 100cc/hr -NPO for now -Eventual EGD, once neuro evaluation completed  -No alcohol discussed -No NSAIDs -Check CMP, lipase in am  2. Iron deficiency anemia, most likely due to malnutrition.  Hg 8.2 -> 9.5 -> 7.9. Iron 15. Ferritin  6. She received Feraheme 510 mg IV.  Stool guaiac negative. -CBC in am  3. Constipation.  CTAP identified a prominent stool ball in the rectum measuring 5.8 cm. Rectal exam today showed soft brown stool in the rectum, stool was broken into small pieces manually but not removed from the rectum. No fecal impaction. Stool guaiac negative.  Water enema x 2 if patient willing  Miralax Q HS  4. LUE flacid/progressive weakness x 6 months. Bilateral lower extremity weakness x 1 month. Neuro consult recommended   5. Depression. Prior history or eating disorder, suspect ongoing anorexia.  Consider psych evaluation    Noralyn Pick  04/16/2020, 12:18 PM   I have reviewed the entire case in detail with the above APP and discussed the plan in detail.  Therefore, I agree with the diagnoses recorded above. In addition,  I have personally interviewed and examined the patient and have personally reviewed any abdominal/pelvic CT scan images.  My  additional thoughts are as follows:  Complex overall clinical picture, neurologic issues of unclear duration and no explanation at this time.  At first she said they have been going on perhaps 6 months, then "I have been like this for years". She has poor appetite for at least many months and probably weight loss but she is not sure and does not get regular medical care.  She has interest in food when she prepares it for the family, but after a couple of bites does not want anymore.  Eating does not cause abdominal pain, nausea or vomiting, she just loses her appetite for no apparent reason.  I have personally reviewed the CT scan, and I am concerned because it is very abnormal.  While the suggestion of "groove pancreatitis" was raised by radiology, I am more concerned that there is either severe ulcer disease or perhaps malignancy in the distal stomach or proximal duodenum with significant adjacent edema.  No gastric dilatation to suggest outlet obstruction.  She also does not have pain or tenderness on exam, but I am wondering if this mysterious underlying neurologic problem may also be affecting visceral sensation.  I recommended she undergo upper endoscopy tomorrow.  I do not think that has to wait until diagnosis of her neurologic condition, as she does not have apparent respiratory compromise.  I described the procedure in detail along with risks and benefits, but she is apprehensive about doing it.  She feels overwhelmed by the hospitalization, amount of information she is receiving, and fear about her unknown diagnosis and clinical course.  Understanding that, I told her to think about it some more, and one of our team can round on her tomorrow to see how she feels about it.  Hold on any further abdominal imaging.   Nelida Meuse III Office:[phone]

## 2020-04-16 NOTE — Progress Notes (Signed)
   Subjective:   Patient asked if I thought she was acting strange last night. I discussed when I met her last night I was concerned she was guarded. She explained she has a fear of doctors and hospitals. We apologized if she was offended. We discussed findings of her imaging and plan to consult our GI colleagues. She is hesitant, but agreeable. She has a history of anemia, but does not know the etiology. She has normal periods. No history of dark stools to her knowledge.  Objective:  Vital signs in last 24 hours: Vitals:   04/15/20 2015 04/15/20 2104 04/16/20 0056 04/16/20 0545  BP: 111/76 124/90 128/82   Pulse: 95 90 89   Resp: 15 20    Temp: 98.9 F (37.2 C) 98.9 F (37.2 C) 98.7 F (37.1 C)   TempSrc:  Oral Oral   SpO2: 100% 100% 97%   Height:    _0  (1.626 m)   General: female, appears older than stated age, lying in bed, NAD. CV: normal rate and regular rhythm, no m/r/g Pulm: CTABL, no adventitious sounds noted Abdomen: soft, nondistended, nontender to palpation. +BS GU: On purewhick, canister red, period started Friday. Neuro: left sided weakness unchanged from yesterday. Sensation bilaterally intact throughout today.  Psych: Patient still anxious and scared on exam today.  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 Lipase level 34. CTA abd/pel showing suspected non-perforated posterolateral wall duodenal bulb ulcer with prominent fluid within pancreaticoduodenal groove, concerning for groove pancreatitis with secondary duodenitis vs PUD. Patient appears to have an underlying undiagnosed psychiatric illness that may be contributing to her current presentation. - Hgb 8.2 > 7.9 - will consult GI today for possible EGD - NPO status - continue PPI - TSH wnl -  UDS pending - iron panel consistent with IDA, given 1 dose of IV feraheme - daily cbc - CIWA w/ ativan (has not required any as of yet) - IV thiamine - folate, MVA when not NPO - Mg 1.5, repleted  Iron deficiency anemia likely due to above As per patient, this is a chronic issue. - Hgb 8.2 > 7.9 - MCV 89 - Iron panel showing iron 15 / ferritin 6 / TIBC 367  - Retic count 2.1 - Given one dose of IV feraheme on 04/16/20 - Will likely need additional iron supplementation   Chronic Malnutrition Husband reports that patient has not been eating well for past 2 years. - albumin 2.1 > 1.7 - consult RD after GI evaluation - IV thiamine - B12 level wnl, folate pending   Prior to Admission Living Arrangement: Home Anticipated Discharge Location: TBD Barriers to Discharge: continued medical management Dispo: TBD   Virl Axe, MD 04/16/2020, 7:48 AM Pager: [phone] After 5pm on weekdays and 1pm on weekends: On Call pager [phone]

## 2020-04-17 ENCOUNTER — Inpatient Hospital Stay (HOSPITAL_COMMUNITY): Payer: Self-pay

## 2020-04-17 DIAGNOSIS — D509 Iron deficiency anemia, unspecified: Secondary | ICD-10-CM | POA: Diagnosis present

## 2020-04-17 DIAGNOSIS — K269 Duodenal ulcer, unspecified as acute or chronic, without hemorrhage or perforation: Secondary | ICD-10-CM

## 2020-04-17 DIAGNOSIS — D62 Acute posthemorrhagic anemia: Secondary | ICD-10-CM | POA: Diagnosis present

## 2020-04-17 DIAGNOSIS — E162 Hypoglycemia, unspecified: Secondary | ICD-10-CM | POA: Diagnosis present

## 2020-04-17 DIAGNOSIS — R29898 Other symptoms and signs involving the musculoskeletal system: Secondary | ICD-10-CM | POA: Diagnosis present

## 2020-04-17 LAB — COMPREHENSIVE METABOLIC PANEL
ALT: 12 U/L (ref 0–44)
AST: 20 U/L (ref 15–41)
Albumin: 1.5 g/dL — ABNORMAL LOW (ref 3.5–5.0)
Alkaline Phosphatase: 60 U/L (ref 38–126)
Anion gap: 10 (ref 5–15)
BUN: <5 mg/dL — ABNORMAL LOW (ref 6–20)
CO2: 24 mmol/L (ref 22–32)
Calcium: 8.2 mg/dL — ABNORMAL LOW (ref 8.9–10.3)
Chloride: 101 mmol/L (ref 98–111)
Creatinine, Ser: 0.64 mg/dL (ref 0.44–1.00)
GFR, Estimated: >60 mL/min (ref >60)
Glucose, Bld: 56 mg/dL — ABNORMAL LOW (ref 70–99)
Potassium: 3.6 mmol/L (ref 3.5–5.1)
Sodium: 135 mmol/L (ref 135–145)
Total Bilirubin: 2 mg/dL — ABNORMAL HIGH (ref 0.3–1.2)
Total Protein: 5.6 g/dL — ABNORMAL LOW (ref 6.5–8.1)

## 2020-04-17 LAB — URINALYSIS, ROUTINE W REFLEX MICROSCOPIC
Glucose, UA: NEGATIVE mg/dL
Ketones, ur: >80 mg/dL — AB
Nitrite: NEGATIVE
Protein, ur: 30 mg/dL — AB
Specific Gravity, Urine: 1.025 (ref 1.005–1.030)
pH: 5.5 (ref 5.0–8.0)

## 2020-04-17 LAB — CBC
HCT: 23.2 % — ABNORMAL LOW (ref 36.0–46.0)
Hemoglobin: 7 g/dL — ABNORMAL LOW (ref 12.0–15.0)
MCH: 27.3 pg (ref 26.0–34.0)
MCHC: 30.2 g/dL (ref 30.0–36.0)
MCV: 90.6 fL (ref 80.0–100.0)
Platelets: 270 10*3/uL (ref 150–400)
RBC: 2.56 MIL/uL — ABNORMAL LOW (ref 3.87–5.11)
RDW: 22.3 % — ABNORMAL HIGH (ref 11.5–15.5)
WBC: 4.6 10*3/uL (ref 4.0–10.5)
nRBC: 0 % (ref 0.0–0.2)

## 2020-04-17 LAB — RETICULOCYTES
Immature Retic Fract: 29.6 % — ABNORMAL HIGH (ref 2.3–15.9)
RBC.: 2.5 MIL/uL — ABNORMAL LOW (ref 3.87–5.11)
Retic Count, Absolute: 82.5 10*3/uL (ref 19.0–186.0)
Retic Ct Pct: 3.3 % — ABNORMAL HIGH (ref 0.4–3.1)

## 2020-04-17 LAB — URINALYSIS, MICROSCOPIC (REFLEX)
RBC / HPF: >50 RBC/hpf (ref 0–5)
WBC, UA: >50 WBC/hpf (ref 0–5)

## 2020-04-17 LAB — LIPASE, BLOOD: Lipase: 38 U/L (ref 11–51)

## 2020-04-17 LAB — PROTEIN / CREATININE RATIO, URINE
Creatinine, Urine: 57.31 mg/dL
Protein Creatinine Ratio: 1.15 mg/mg{Cre} — ABNORMAL HIGH (ref 0.00–0.15)
Total Protein, Urine: 66 mg/dL

## 2020-04-17 LAB — BILIRUBIN, DIRECT: Bilirubin, Direct: 0.1 mg/dL (ref 0.0–0.2)

## 2020-04-17 LAB — LACTATE DEHYDROGENASE: LDH: 93 U/L — ABNORMAL LOW (ref 98–192)

## 2020-04-17 LAB — PREPARE RBC (CROSSMATCH)

## 2020-04-17 MED ORDER — PROSOURCE PLUS PO LIQD
30.0000 mL | Freq: Two times a day (BID) | ORAL | Status: DC
  Administered 2020-04-18 – 2020-04-21 (×6): 30 mL via ORAL
  Filled 2020-04-17 (×8): qty 30

## 2020-04-17 MED ORDER — GADOBUTROL 1 MMOL/ML IV SOLN
4.5000 mL | Freq: Once | INTRAVENOUS | Status: AC | PRN
  Administered 2020-04-17: 4.5 mL via INTRAVENOUS

## 2020-04-17 MED ORDER — BOOST / RESOURCE BREEZE PO LIQD CUSTOM
1.0000 | Freq: Three times a day (TID) | ORAL | Status: DC
  Administered 2020-04-17 – 2020-04-20 (×5): 1 via ORAL

## 2020-04-17 MED ORDER — SODIUM CHLORIDE 0.9% IV SOLUTION: Freq: Once | INTRAVENOUS | Status: AC
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
## 2020-04-17 NOTE — Progress Notes (Signed)
   Patient Name: Gabrielle Singh Date of Encounter: 04/17/2020, 10:00 AM    Subjective  Patient reports she is hungry.  She denies any pain.  Says she is afraid of the sedation and is not ready to undergo upper GI endoscopy.   Objective  BP 96/62 (BP Location: Left Arm)   Pulse 91   Temp 98.3 F (36.8 C) (Oral)   Resp 16   Ht 5\' 4"  (1.626 m)   Wt 49.8 kg   SpO2 100%   BMI 18.85 kg/m   Lab Results  Component Value Date   ALT 12 04/17/2020   AST 20 04/17/2020   ALKPHOS 60 04/17/2020   BILITOT 2.0 (H) 04/17/2020    Lab Results  Component Value Date   WBC 4.6 04/17/2020   HGB 7.0 (L) 04/17/2020   HCT 23.2 (L) 04/17/2020   MCV 90.6 04/17/2020   PLT 270 04/17/2020   Lab Results  Component Value Date   CREATININE 0.64 04/17/2020   BUN <5 (L) 04/17/2020   NA 135 04/17/2020   K 3.6 04/17/2020   CL 101 04/17/2020   CO2 24 04/17/2020   Lab Results  Component Value Date   LIPASE 38 04/17/2020     Assessment and Plan  #1 duodenal ulcer versus groove pancreatitis or both.  Normal lipase goes against pancreatitis though depending upon time course could be normal.  Malignancy not ruled out.  Needs upper GI endoscopy will come back tomorrow to see if she is ready to do so.  I explained that getting a diagnosis is important and reviewed the risks benefits and indications of the procedure.  Continue IV PPI will allow clear liquids advance to full if tolerated hold n.p.o. in the morning in case she is ready to have EGD  #2 fecal impaction-constipation stool ball in rectum  Enema treatment   #3 malnutrition including folate deficiency I would go ahead and have nutrition consult started, supplement folate  #4 iron deficiency anemia GI blood loss certainly possible added impetus to do upper endoscopy.  Certainly could be nutritional as well.  Has received 1 dose of Feraheme.  #5 history of eating disorder-bulimia, depression likely question what role this may have currently    #6 mild bilirubin elevation would fractionate question indirect.  I have ordered this on existing labs  Gatha Mayer, MD, [facility] 04/17/2020 10:00 AM

## 2020-04-17 NOTE — TOC Initial Note (Signed)
Transition of Care [facility]) - Initial/Assessment Note    Patient Details  Name: Gabrielle Singh MRN: [id number] Date of Birth: Aug 02, 1985  Transition of Care [facility]) CM/SW Contact:    Bartholomew Crews, RN Phone Number: [phone] 04/17/2020, 2:13 PM  Clinical Narrative:                  Spoke with patient at the bedside. PTA home with spouse and 2 daughters. Has a walker at home. No PCP. Stated that about 6 months ago she paid for a virtual visit who recommended follow up for RA work up. She is agreeable to an appointment with [facility]. Appointment scheduled for Wednesday 11/10 10:30 am.   Patient states that she has a walker at home.   Financial counselor is assisting patient with Medicaid application.   Will follow for potential MATCH needs. TOC following for transition needs.   Expected Discharge Plan: Home/Self Care Barriers to Discharge: Continued Medical Work up   Patient Goals and CMS Choice Patient states their goals for this hospitalization and ongoing recovery are:: return home with family CMS Medicare.gov Compare Post Acute Care list provided to:: Patient Choice offered to / list presented to : NA  Expected Discharge Plan and Services Expected Discharge Plan: Home/Self Care In-house Referral: Financial Counselor, Clinical Social Work Discharge Planning Services: CM Consult Post Acute Care Choice: NA Living arrangements for the past 2 months: Single Family Home                 DME Arranged: N/A DME Agency: NA       HH Arranged: NA [place] Agency: NA        Prior Living Arrangements/Services Living arrangements for the past 2 months: [place] with:: Self, Minor Children, Spouse Patient language and need for interpreter reviewed:: Yes        Need for Family Participation in Patient Care: Yes (Comment) Care giver support system in place?: Yes (comment) Current home services: DME (walker) Criminal Activity/Legal Involvement Pertinent to Current  Situation/Hospitalization: No - Comment as needed  Activities of Daily Living Home Assistive Devices/Equipment: Walker (specify type) ADL Screening (condition at time of admission) Patient's cognitive ability adequate to safely complete daily activities?: Yes Is the patient deaf or have difficulty hearing?: No Does the patient have difficulty seeing, even when wearing glasses/contacts?: No Does the patient have difficulty concentrating, remembering, or making decisions?: No Patient able to express need for assistance with ADLs?: Yes Does the patient have difficulty dressing or bathing?: Yes Independently performs ADLs?: No Communication: Dependent, Needs assistance Is this a change from baseline?: Pre-admission baseline Dressing (OT): Dependent Is this a change from baseline?: Pre-admission baseline Grooming: Dependent Is this a change from baseline?: Pre-admission baseline Feeding: Dependent Is this a change from baseline?: Pre-admission baseline Toileting: Dependent Is this a change from baseline?: Pre-admission baseline In/Out Bed: Dependent Is this a change from baseline?: Pre-admission baseline Walks in Home: Dependent Is this a change from baseline?: Pre-admission baseline Does the patient have difficulty walking or climbing stairs?: Yes Weakness of Legs: Both Weakness of Arms/Hands: Both  Permission Sought/Granted                  Emotional Assessment Appearance:: Appears stated age Attitude/Demeanor/Rapport: Engaged Affect (typically observed): Accepting Orientation: : Oriented to Self, Oriented to  Time, Oriented to Place, Oriented to Situation Alcohol / Substance Use: Alcohol Use Psych Involvement: No (comment)  Admission diagnosis:  Weakness [R53.1] Abdominal pain [R10.9] Acute gastritis without hemorrhage,  unspecified gastritis type [K29.00] Patient Active Problem List   Diagnosis Date Noted  . Pressure injury of skin 04/16/2020  . Abdominal pain  04/15/2020   PCP:  Patient, No Pcp Per Pharmacy:  No Pharmacies Listed    Social Determinants of Health (SDOH) Interventions    Readmission Risk Interventions No flowsheet data found.

## 2020-04-17 NOTE — Progress Notes (Addendum)
Initial Nutrition Assessment  DOCUMENTATION CODES:   Not applicable  INTERVENTION:  Boost Breeze po TID, each supplement provides 250 kcal and 9 grams of protein  70m Prosource Plus po BID, each supplement provides 100 kcal and 15 grams of protein  Continue MVI daily  NUTRITION DIAGNOSIS:   Inadequate oral intake related to decreased appetite as evidenced by per patient/family report.    GOAL:   Patient will meet greater than or equal to 90% of their needs    MONITOR:   PO intake, Supplement acceptance, Diet advancement, Skin, Weight trends, Labs, I & O's  REASON FOR ASSESSMENT:   Consult Assessment of nutrition requirement/status  ASSESSMENT:   Pt with no PMH presented with L-sided pain and weakness for the past 3 days. 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  History difficult to obtain from pt as her thought process is nonlinear. Per H&P, pt reported loss of appetite since 1 day PTA and also reported some recent wt loss, though the amount was not specified. Pt's husband reported that pt drinks EtOH daily. Per the husband, "she does not get drunk every day but she does drink a good bit every day." Pt also consumes baking soda regularly per her husband and has not been consuming food for the past few days PTA, but has "not been eating well" for the past 2 years.   GI met with pt today who reported being fearful of sedation and not being ready to undergo EGD. GI will attempt again tomorrow.    No PO intake documented. Pt on clear liquid diet. Will order Boost Breeze TID and Prosource Plus BID to provide additional kcals/protein. Pt to be NPO in the morning if she is agreeable to proceed with EGD.   No PO intake since admit.   No wt history available for review. Suspect pt meets criteria for malnutrition; however, unable to diagnose at this time without wt history and/or nutrition-focused physical exam.   UOP: [id number]  hours  Labs reviewed.  Medications: folvite, MVI, protonix, miralax, thiamine LR @ 10064mr  NUTRITION - FOCUSED PHYSICAL EXAM:  Unable to perform at this time, will attempt at follow-up.  Diet Order:   Diet Order            Diet clear liquid Room service appropriate? Yes; Fluid consistency: Thin  Diet effective now                 EDUCATION NEEDS:   No education needs have been identified at this time  Skin:  Skin Assessment: Skin Integrity Issues: Skin Integrity Issues:: Stage II Stage II: coccyx  Last BM:  10/24  Height:   Ht Readings from Last 1 Encounters:  04/16/20 5' 4"  (1.626 m)    Weight:   Wt Readings from Last 1 Encounters:  04/16/20 49.8 kg    BMI:  Body mass index is 18.85 kg/m.  Estimated Nutritional Needs:   Kcal:  [id number]:  85-100 grams  Fluid:  >/=1.75L/d    AmaLarkin InaS, RD, LDN RD pager number and weekend/on-call pager number located in [facility]

## 2020-04-18 DIAGNOSIS — R933 Abnormal findings on diagnostic imaging of other parts of digestive tract: Secondary | ICD-10-CM

## 2020-04-18 DIAGNOSIS — R109 Unspecified abdominal pain: Secondary | ICD-10-CM

## 2020-04-18 DIAGNOSIS — D62 Acute posthemorrhagic anemia: Secondary | ICD-10-CM

## 2020-04-18 LAB — CBC
HCT: 29.4 % — ABNORMAL LOW (ref 36.0–46.0)
Hemoglobin: 8.9 g/dL — ABNORMAL LOW (ref 12.0–15.0)
MCH: 26.9 pg (ref 26.0–34.0)
MCHC: 30.3 g/dL (ref 30.0–36.0)
MCV: 88.8 fL (ref 80.0–100.0)
Platelets: 284 10*3/uL (ref 150–400)
RBC: 3.31 MIL/uL — ABNORMAL LOW (ref 3.87–5.11)
RDW: 20.1 % — ABNORMAL HIGH (ref 11.5–15.5)
WBC: 5.6 10*3/uL (ref 4.0–10.5)
nRBC: 0 % (ref 0.0–0.2)

## 2020-04-18 LAB — GLUCOSE, CAPILLARY
Glucose-Capillary: 133 mg/dL — ABNORMAL HIGH (ref 70–99)
Glucose-Capillary: 39 mg/dL — CL (ref 70–99)
Glucose-Capillary: 45 mg/dL — ABNORMAL LOW (ref 70–99)
Glucose-Capillary: 72 mg/dL (ref 70–99)
Glucose-Capillary: 78 mg/dL (ref 70–99)
Glucose-Capillary: 71 mg/dL (ref 70–99)
Glucose-Capillary: 92 mg/dL (ref 70–99)
Glucose-Capillary: 101 mg/dL — ABNORMAL HIGH (ref 70–99)

## 2020-04-18 LAB — HAPTOGLOBIN: Haptoglobin: 94 mg/dL (ref 33–278)

## 2020-04-18 LAB — GLUCOSE, RANDOM: Glucose, Bld: 64 mg/dL — ABNORMAL LOW (ref 70–99)

## 2020-04-18 LAB — PREALBUMIN: Prealbumin: 12 mg/dL — ABNORMAL LOW (ref 18–38)

## 2020-04-18 LAB — BETA-HYDROXYBUTYRIC ACID: Beta-Hydroxybutyric Acid: 0.15 mmol/L (ref 0.05–0.27)

## 2020-04-18 MED ORDER — DEXTROSE IN LACTATED RINGERS 5 % IV SOLN: INTRAVENOUS | Status: DC

## 2020-04-18 MED ORDER — DEXTROSE 50 % IV SOLN
INTRAVENOUS | Status: AC
  Administered 2020-04-18: 50 mL
  Filled 2020-04-18: qty 50

## 2020-04-18 MED ORDER — DEXTROSE 50 % IV SOLN
INTRAVENOUS | Status: AC
  Filled 2020-04-18: qty 50

## 2020-04-18 MED ORDER — PANTOPRAZOLE SODIUM 40 MG PO TBEC
40.0000 mg | DELAYED_RELEASE_TABLET | Freq: Two times a day (BID) | ORAL | Status: DC
  Administered 2020-04-18 – 2020-04-20 (×4): 40 mg via ORAL
  Filled 2020-04-18 (×6): qty 1

## 2020-04-18 NOTE — Progress Notes (Signed)
MD wants to be notified if patient CBG <70 before giving her Dextrose. There are some test that he needs done when her CBG is below <70.

## 2020-04-18 NOTE — Progress Notes (Signed)
Daily Rounding Note  04/18/2020, 9:38 AM  LOS: 3 days   SUBJECTIVE:   Chief complaint:    Duodenal ulcer vs groove pancreatitis  Abdominal pain, nausea/vomiting better.  Having brown stools.    OBJECTIVE:         Vital signs in last 24 hours:    Temp:  [98.1 F (36.7 C)-98.7 F (37.1 C)] 98.7 F (37.1 C) (10/26 0410) Pulse Rate:  [79-95] 79 (10/26 0410) Resp:  [16] 16 (10/26 0410) BP: (90-98)/(59-63) 97/63 (10/26 0410) SpO2:  [100 %] 100 % (10/26 0410) Weight:  [49.6 kg] 49.6 kg (10/25 2200) Last BM Date: 04/16/20 Filed Weights   04/16/20 2052 04/17/20 2200  Weight: 49.8 kg 49.6 kg   General: cachectic, alert   Heart: RRR Chest: no labored breathing Abdomen: thin, not distended, not palpated or auscultated.    Extremities: thin, sarcopenia Neuro/Psych:  Speech fluid.  Not overtly anxious.  Oriented x 3.  Moves all 4 limbs w/o tremor.  Strength not tested.    Intake/Output from previous day: 10/25 0701 - 10/26 0700 In: 2145.7 [I.V.:1830.7; Blood:315] Out: 600 [Urine:600]  Intake/Output this shift: Total I/O In: 305.5 [I.V.:305.5] Out: -   Lab Results: Recent Labs    04/16/20 0240 04/17/20 0332 04/18/20 0707  WBC 5.3 4.6 5.6  HGB 7.9* 7.0* 8.9*  HCT 25.9* 23.2* 29.4*  PLT 312 270 284   BMET Recent Labs    04/15/20 1334 04/15/20 1334 04/15/20 1342 04/16/20 0240 04/17/20 0332  NA 134*   < > 135 136 135  K 3.4*   < > 3.5 3.8 3.6  CL 97*   < > 95* 103 101  CO2 27  --   --  25 24  GLUCOSE 85   < > 86 67* 56*  BUN 6   < > 5* <5* <5*  CREATININE 0.62   < > 0.50 0.56 0.64  CALCIUM 8.9  --   --  8.5* 8.2*   < > = values in this interval not displayed.   LFT Recent Labs    04/15/20 1334 04/16/20 0240 04/17/20 0332  PROT 7.3 6.2* 5.6*  ALBUMIN 2.1* 1.7* 1.5*  AST 32 24 20  ALT 17 15 12   ALKPHOS 82 70 60  BILITOT 0.7 0.8 2.0*  BILIDIR  --   --  0.1   PT/INR Recent Labs     04/15/20 1334  LABPROT 13.2  INR 1.1   Hepatitis Panel No results for input(s): HEPBSAG, HCVAB, HEPAIGM, HEPBIGM in the last 72 hours.  Studies/Results: MR BRAIN W WO CONTRAST MR CERVICAL SPINE W WO CONTRAST  Result Date: 04/17/2020 CLINICAL DATA:  Left arm weakness.  History of multiple sclerosis. EXAM: MRI HEAD WITHOUT AND WITH CONTRAST MRI CERVICAL SPINE WITHOUT AND WITH CONTRAST TECHNIQUE: Multiplanar, multiecho pulse sequences of the brain and surrounding structures, and cervical spine, to include the craniocervical junction and cervicothoracic junction, were obtained without and with intravenous contrast. CONTRAST:  4.3mL GADAVIST GADOBUTROL 1 MMOL/ML IV SOLN COMPARISON:  None. FINDINGS: MRI HEAD FINDINGS Brain: No acute infarct, acute hemorrhage or extra-axial collection. Normal white matter signal. Normal volume of CSF spaces. Extensive magnetic susceptibility effect consistent with recent iron administration. Normal midline structures. Vascular: Normal flow voids. Skull and upper cervical spine: Normal marrow signal. Sinuses/Orbits: Negative. Other: None. MRI CERVICAL SPINE FINDINGS Alignment: Physiologic. Vertebrae: No fracture, evidence of discitis, or bone lesion. Cord: Normal signal and morphology. Posterior Fossa, vertebral  arteries, paraspinal tissues: Negative. Disc levels: No disc herniation or stenosis. IMPRESSION: 1. Normal MRI of the brain and cervical spine. 2. Extensive magnetic susceptibility effect consistent with recent iron administration. Electronically Signed   By: Ulyses Jarred M.D.   On: 04/17/2020 22:33   Scheduled Meds: . (feeding supplement) PROSource Plus  30 mL Oral BID BM  . feeding supplement  1 Container Oral TID BM  . folic acid  1 mg Oral Daily  . multivitamin with minerals  1 tablet Oral Daily  . pantoprazole (PROTONIX) IV  40 mg Intravenous Q12H  . polyethylene glycol  17 g Oral QHS  . sodium chloride flush  3 mL Intravenous Once  . thiamine  100 mg  Oral Daily   Or  . thiamine  100 mg Intravenous Daily   Continuous Infusions: . lactated ringers 100 mL/hr at 04/18/20 0815   PRN Meds:.   ASSESMENT:   *  Duodenitis, DU.  ? Groove pancreatitis.   Lipase normal in 30s.  T bili 2, O/w normal LFTs.   Day 4 Protonix 40 mg IV bid.   *   IDA anemia. Feraheme on 10/24.   Hgb 7 >> 1 PRBC >> 8.9.  FOBT status not yet determined.    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  Head CT w bil maxillary sinusitis, O/w unremarkable.  Brain/C-spine MRI unremarkable.    *   Depression, hx eating disorder.  Anxiety, untreated.    *   Wt loss, PCM of undetermined severity.  Low albumin and pre-albumin.     *   Constipation, fecal impaction.  Improved w HS Miralax x 2 days.     PLAN   *   Pt still not willing to consent to EGD, despite d/w anesthesiologist (do not see note from anesthesiology but pt says she spoke w someone) and GI providers.  Please call us back when pt consents to EGD.  In meantime going to switch to po Protonix tonight.   Heart healthy (low fat) diet.        Azucena Freed  04/18/2020, 9:38 AM Phone [phone]
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
## 2020-04-18 NOTE — Progress Notes (Signed)
Hypoglycemic Event  CBG: 39  Treatment: D50 50 mL (25 gm)  Symptoms: Hungry  Follow-up CBG: Time:8:07 CBG Result:133  Possible Reasons for Event: Other: Patient NPO for possible procedure  Comments/MD notified:MD Notified    Franciszek Platten K Dago Jungwirth

## 2020-04-19 ENCOUNTER — Encounter (HOSPITAL_COMMUNITY)
Admission: EM | Disposition: A | Discharge status: Rehabilitation Facility | Payer: Self-pay | Source: Home / Self Care | Attending: Internal Medicine

## 2020-04-19 LAB — BPAM RBC
Blood Product Expiration Date: 202111142359
ISSUE DATE / TIME: 202110260043
Unit Type and Rh: 7300

## 2020-04-19 LAB — CBC WITH DIFFERENTIAL/PLATELET
Abs Immature Granulocytes: 0.02 10*3/uL (ref 0.00–0.07)
Basophils Absolute: 0 10*3/uL (ref 0.0–0.1)
Basophils Relative: 1 %
Eosinophils Absolute: 0.1 10*3/uL (ref 0.0–0.5)
Eosinophils Relative: 1 %
HCT: 27.2 % — ABNORMAL LOW (ref 36.0–46.0)
Hemoglobin: 8.4 g/dL — ABNORMAL LOW (ref 12.0–15.0)
Immature Granulocytes: 0 %
Lymphocytes Relative: 34 %
Lymphs Abs: 1.6 10*3/uL (ref 0.7–4.0)
MCH: 27.7 pg (ref 26.0–34.0)
MCHC: 30.9 g/dL (ref 30.0–36.0)
MCV: 89.8 fL (ref 80.0–100.0)
Monocytes Absolute: 0.4 10*3/uL (ref 0.1–1.0)
Monocytes Relative: 9 %
Neutro Abs: 2.5 10*3/uL (ref 1.7–7.7)
Neutrophils Relative %: 55 %
Platelets: 264 10*3/uL (ref 150–400)
RBC: 3.03 MIL/uL — ABNORMAL LOW (ref 3.87–5.11)
RDW: 20.4 % — ABNORMAL HIGH (ref 11.5–15.5)
WBC: 4.6 10*3/uL (ref 4.0–10.5)
nRBC: 0 % (ref 0.0–0.2)

## 2020-04-19 LAB — GLUCOSE, CAPILLARY
Glucose-Capillary: 95 mg/dL (ref 70–99)
Glucose-Capillary: 92 mg/dL (ref 70–99)
Glucose-Capillary: 102 mg/dL — ABNORMAL HIGH (ref 70–99)
Glucose-Capillary: 84 mg/dL (ref 70–99)
Glucose-Capillary: 115 mg/dL — ABNORMAL HIGH (ref 70–99)

## 2020-04-19 LAB — BASIC METABOLIC PANEL
Anion gap: 8 (ref 5–15)
BUN: <5 mg/dL — ABNORMAL LOW (ref 6–20)
CO2: 27 mmol/L (ref 22–32)
Calcium: 8.3 mg/dL — ABNORMAL LOW (ref 8.9–10.3)
Chloride: 104 mmol/L (ref 98–111)
Creatinine, Ser: 0.57 mg/dL (ref 0.44–1.00)
GFR, Estimated: >60 mL/min (ref >60)
Glucose, Bld: 86 mg/dL (ref 70–99)
Potassium: 4.1 mmol/L (ref 3.5–5.1)
Sodium: 139 mmol/L (ref 135–145)

## 2020-04-19 LAB — TYPE AND SCREEN
ABO/RH(D): B POS
Antibody Screen: NEGATIVE
Unit division: 0

## 2020-04-19 LAB — C-PEPTIDE: C-Peptide: 0.9 ng/mL — ABNORMAL LOW (ref 1.1–4.4)

## 2020-04-19 LAB — PHOSPHORUS: Phosphorus: 4.3 mg/dL (ref 2.5–4.6)

## 2020-04-19 LAB — MAGNESIUM: Magnesium: 1.6 mg/dL — ABNORMAL LOW (ref 1.7–2.4)

## 2020-04-19 LAB — INSULIN, RANDOM: Insulin: <0.4 u[IU]/mL — ABNORMAL LOW (ref 2.6–24.9)

## 2020-04-19 SURGERY — ESOPHAGOGASTRODUODENOSCOPY (EGD) WITH PROPOFOL
Anesthesia: Monitor Anesthesia Care

## 2020-04-19 MED ORDER — SODIUM CHLORIDE 0.9 % IV SOLN
510.0000 mg | Freq: Once | INTRAVENOUS | Status: AC
  Administered 2020-04-19: 510 mg via INTRAVENOUS
  Filled 2020-04-19: qty 17

## 2020-04-19 NOTE — Evaluation (Addendum)
Physical Therapy Evaluation Patient Details Name: Gabrielle Singh MRN: [id number] DOB: March 15, 1986 Today's Date: 04/19/2020   History of Present Illness  is a 34 y.o. female who presents to the Emergency Department complaining of weakness and abdominal pain. She presents the emergency department for evaluation of left sided weakness as well as abdominal pain. Pt given past possible RA diagnosis via telehealth wtih no neurology follow up noted  Clinical Impression  Pt has declined in functional mobility over the last 3 months; pt states she now requiring assist from husband to carry her around the house; pt demonstrating ability to move LLE during tasks but not on commands, no movement of LUE noted; pt requiring mod A for bed mobility as well as squat pivot tranfers; pt demonstrating deficits in balance, strength, coordination, safety, bed moiblity and transfers; husband states he needs to learn how to assist pt at home; pt and husband will benefit from skilled PT interventions to address deficits and questions/concerns to maximize independence and safety to functional mobility tasks prior to discharge.     Follow Up Recommendations CIR    Equipment Recommendations  Other (comment) (TBD; possible need for w/c)    Recommendations for Other Services Rehab consult;OT consult     Precautions / Restrictions Precautions Precautions: Fall      Mobility  Bed Mobility Overal bed mobility: Needs Assistance Bed Mobility: Supine to Sit     Supine to sit: Mod assist;HOB elevated          Transfers Overall transfer level: Needs assistance   Transfers: Squat Pivot Transfers     Squat pivot transfers: Mod assist     General transfer comment: pt given extensive education on head/hip relationship as well as education on need to anteriorly weight shift; PT noted increased resistance posteriorly during transfer  Ambulation/Gait                Stairs            Wheelchair  Mobility    Modified Rankin (Stroke Patients Only)       Balance Overall balance assessment: Needs assistance Sitting-balance support: Feet supported Sitting balance-Leahy Scale: Fair       Standing balance-Leahy Scale: Zero                               Pertinent Vitals/Pain      Home Living Family/patient expects to be discharged to:: Private residence Living Arrangements: Spouse/significant other;Children Available Help at Discharge: Family;Available 24 hours/day Type of Home: House Home Access: Level entry     Home Layout: Multi-level Home Equipment: Walker - 2 wheels;Shower seat;Bedside commode      Prior Function Level of Independence: Needs assistance   Gait / Transfers Assistance Needed: husband has been carrying her around the house and up/down the stairs the last 3 months  ADL's / Homemaking Assistance Needed: husband assist or completely performs tasks        Hand Dominance   Dominant Hand: Right    Extremity/Trunk Assessment   Upper Extremity Assessment Upper Extremity Assessment: LUE deficits/detail LUE Deficits / Details: LUE flaccid, no active movement noted LUE Sensation: WNL    Lower Extremity Assessment Lower Extremity Assessment: LLE deficits/detail LLE Deficits / Details: pt states she can't move her LLE, during evaluation pt able to assist with adduction to get to EOB as well as knee extension noted when therapist donning socks; pt states her leg will sometimes work  LLE Sensation: WNL       Communication   Communication: No difficulties  Cognition Arousal/Alertness: Awake/alert Behavior During Therapy: WFL for tasks assessed/performed Overall Cognitive Status: Within Functional Limits for tasks assessed                                        General Comments General comments (skin integrity, edema, etc.): VSS, RN present in room during most of evaluation    Exercises     Assessment/Plan    PT  Assessment Patient needs continued PT services  PT Problem List Decreased strength;Decreased mobility;Decreased safety awareness;Impaired tone;Decreased range of motion;Decreased coordination;Decreased activity tolerance;Decreased balance       PT Treatment Interventions Therapeutic exercise;Wheelchair mobility training;Gait training;Balance training;Stair training;Neuromuscular re-education;Functional mobility training;Patient/family education;Therapeutic activities    PT Goals (Current goals can be found in the Care Plan section)  Acute Rehab PT Goals Patient Stated Goal: "I want to walk" PT Goal Formulation: With patient/family Time For Goal Achievement: 05/03/20 Potential to Achieve Goals: Fair    Frequency Min 4X/week   Barriers to discharge        Co-evaluation               AM-PAC PT "6 Clicks" Mobility  Outcome Measure Help needed turning from your back to your side while in a flat bed without using bedrails?: A Little Help needed moving from lying on your back to sitting on the side of a flat bed without using bedrails?: A Lot Help needed moving to and from a bed to a chair (including a wheelchair)?: A Lot Help needed standing up from a chair using your arms (e.g., wheelchair or bedside chair)?: A Lot Help needed to walk in hospital room?: Total Help needed climbing 3-5 steps with a railing? : Total 6 Click Score: 11    End of Session Equipment Utilized During Treatment: Gait belt Activity Tolerance: Patient tolerated treatment well Patient left: in chair;with chair alarm set;with family/visitor present;with call bell/phone within reach Nurse Communication: Mobility status PT Visit Diagnosis: Other symptoms and signs involving the nervous system (R29.898);Hemiplegia and hemiparesis;Other abnormalities of gait and mobility (R26.89);Unsteadiness on feet (R26.81) Hemiplegia - Right/Left: Left Hemiplegia - dominant/non-dominant: Dominant Hemiplegia - caused by:  Unspecified    Time: 1139-1205 PT Time Calculation (min) (ACUTE ONLY): 26 min   Charges:   PT Evaluation $PT Eval Low Complexity: 1 Low PT Treatments $Therapeutic Activity: 8-22 mins        Lyanne Co, DPT Acute Rehabilitation Services [phone]  [place] 04/19/2020, 12:49 PM

## 2020-04-19 NOTE — Progress Notes (Addendum)
Again pt not consenting to EGD.  She does not want to undergo EGD, no specific reason given.  Spoke w pt husband for a few minutes.   Cancelled the EGD set for this afternoon.  Resumed low fat (heart healthy diet).   Problems:  Pt w wt loss, anorexia, abdominal pain, anemia, malnutrition.  CT w duodenal ulcer and ? Groove pancreatitis.  Fecal impaction/constipation.    Plan: Continue Protonix 40 mg po bid for  6 weeks, then drop to 1 x daily.  Low fat diet for next 7 to 10 days, then change to regular diet.    No plans to revisit ? Of pursuing EGD after 3 days of pt declining procedure.    Azucena Freed PA-C

## 2020-04-19 NOTE — Progress Notes (Signed)
Paged DR Rachyl Wuebker Sabal regarding patient wanting to leave, wants to go home, page returned and Dr informed, will come to see patient.

## 2020-04-19 NOTE — Progress Notes (Signed)
Inpatient Rehabilitation Admissions Coordinator  Rehab Admissions Coordinator Note:  Patient was screened by Cleatrice Burke for appropriateness for an Inpatient Acute Rehab Consult per therapy recommendations. I do not feel that patient has the medical neccesity need for an acute hospital rehabilitation admit.  Other venue options will need to be pursued.  Cleatrice Burke RN MSN 04/19/2020, 1:19 PM  I can be reached at [phone].

## 2020-04-19 NOTE — Consult Note (Signed)
[facility] Nurse Consult Note: Patient receiving care in [facility] 5M16 Reason for Consult: Coccyx wound Wound type: Stage 1 nonblanchable over bony prominence Pressure Injury POA: Yes Measurement: 1 cm x 0.8 cm Wound bed: dark pigmented skin with whitened area that is nonblanchable  Drainage (amount, consistency, odor) None Periwound: Intact Dressing procedure/placement/frequency: Apply sacral foam dressing to coccyx Peel down foam dressing EACH shift.  Record your observations.  Change foam dressing every 3 days or PRN soiling.  Notify the physician team if the area worsens  Monitor the wound area(s) for worsening of condition such as: Signs/symptoms of infection,  Increase in size,  Development of or worsening of odor, Development of pain, or increased pain at the affected locations.   Notify the medical team if any of these develop.  Thank you for the consult.  Discussed plan of care with the patient and bedside nurse.  [facility] nurse will not follow at this time.   Please re-consult the [facility] team if needed.  Cathlean Marseilles Tamala Julian, MSN, RN, [place], [facility], [facility] Wound Treatment Associate Pager [phone]

## 2020-04-19 NOTE — Progress Notes (Signed)
Paged Gabrielle Singh and is speaking with husband.

## 2020-04-19 NOTE — Progress Notes (Signed)
   Came to room and spoke to patient and husband.  She has had a difficult time with the idea of having a sedated upper endoscopy procedure and essentially is not interested in doing this despite indicating she would she kept backing out of it.  I explained that that is her choice.  I also explained that statistically what we see in the duodenum is most likely a benign ulcer.  That should respond to acid suppression and treatment.  I did specifically explain there could be a cancer that is not diagnosed and delaying an endoscopic procedure and biopsy to know that could cause harm to her and create an untreatable situation.  She said her husband think she has anorexia and an eating disorder and that certainly may be the case.  There does seem to be underlying mental illness but could include depression and eating disorder.  At this point the best I/we can offer is taking a PPI either daily or twice daily and treating her with supportive care.  It seems to me that if she would accept a psychiatric evaluation that would be useful to probe deeper into emotional and mental health issues.  Additional neurologic/physical therapy/rehab evaluation appropriate as you are aware.  Signing off at this point.  When she is ready to be discharged a hospital follow-up in our clinic with one of our advanced practice providers or Dr. Loletha Carrow can be made if necessary or could be made after she follows up with primary care assuming she follows up with a new PCP.  The utility of GI clinic visit is low if the patient is improving with medical treatment and also low if she needs but continues to decline in endoscopic evaluation.  An alternative way to follow her up would be to repeat a CT scan if necessary but I would wait at least a month to do that unless there were other intervening issues.  Gabrielle Mayer, MD, [facility] 04/19/2020 4:37 PM [phone]

## 2020-04-19 NOTE — Progress Notes (Addendum)
Subjective:  Patient feeling cold. No abdominal pain today.  She does not want to go through with the EGD today. Husband is present in the room. Husband reports that patient was saying that the lady who talked to her about the EGD was not pleasant and that she feels like that lady doesn't like her, which makes her scared since they would have full access to her body during the procedure.  She continues to have this fear of doctors and the hospital. Reports that she feels more anxious here. Denies paranoia or visual and auditory hallucinations. Denies any family history of genetic/inherited diseases.  Husband reports that patient's arm and leg weakness has progressively worsened, especially over the past 3 months. Last time that she was moving normally was about 6 months ago. Patient reports that her left arm has been bad for much longer. Informed her of discussion with neurology to follow up as an outpatient to get an EMG and nerve conduction studies performed. She agrees with this plan.  Objective:  Vital signs in last 24 hours: Vitals:   04/18/20 2132 04/19/20 0129 04/19/20 0459 04/19/20 0850  BP: (!) 90/51 (!) 95/59 91/62 (!) 90/55  Pulse: 87 75 84 72  Resp: 18 16 16 18   Temp: 98.7 F (37.1 C)  98.2 F (36.8 C) 98 F (36.7 C)  TempSrc: Oral  Oral Oral  SpO2: 99% 100% 99% 100%  Weight: 49.6 kg     Height:       Weight change: 0 kg  Intake/Output Summary (Last 24 hours) at 04/19/2020 1144 Last data filed at 04/19/2020 0900 Gross per 24 hour  Intake 1357.25 ml  Output 550 ml  Net 807.25 ml   Physical Exam Vitals and nursing note reviewed.  Constitutional:      General: She is not in acute distress. HENT:     Head: Normocephalic and atraumatic.  Cardiovascular:     Rate and Rhythm: Normal rate and regular rhythm.     Heart sounds: Normal heart sounds. No murmur heard.  No friction rub. No gallop.   Pulmonary:     Effort: Pulmonary effort is normal. No respiratory  distress.     Breath sounds: Normal breath sounds.  Abdominal:     General: Abdomen is flat. Bowel sounds are normal. There is no distension.     Palpations: Abdomen is soft.  Neurological:     Mental Status: She is alert.     Comments: Left upper extremity weakness. Sensation is intact.  Psychiatric:        Mood and Affect: Mood is anxious.      Assessment/Plan:  Principal Problem:   Acute on chronic blood loss anemia Active Problems:   Abdominal pain   Pressure injury of skin   Left arm weakness   Iron deficiency anemia   Hypoglycemia   Abnormal CT scan, gastrointestinal tract  34 yo woman with no PMH other than bulimia as a teenager and 2 normal pregnancies who presented with acute left-sided abdominal/flank pain, poor appetite/eating, and 6 months of progressive weakness in her left arm and both legs. No medical care in past 8 years. Hospital course has been significant for recurrent hypoglycemia, progressive anemia requiring transfusion, and anxiety about hospitalization and medical procedures.   Iron deficiency anemia 2/2 blood loss anemia Left-sided abdominal pain Clear iron deficiency with ferritin 6, iron 15. Concern for GI bleeding, possibly PUD vs malignancy. Other possibility would be malnutrition or malabsorption. Retic index is low, likely hypoproliferation  due to iron deficiency. Received IV iron x1 and 1 unit pRBCs with appropriate response, Hgb today 8.4. LDH 93, haptoglobin 94 - likely not hemolysis. Still not consenting to EGD. CT abd/pel showing suspected non-perforated posterolateral wall duodenal bulb ulcer with prominent fluid within pancreaticoduodenal groove, concerning for severe ulceration vs pancreatitis vs malignancy in distal stomach or proximal duodenum. She also has iron deficiency anemia of unclear etiology (see above), concerning for GI blood loss. Patient appears to have an underlying undiagnosed psychiatric illness that may be contributing to her  current presentation. She does have a reported history of bulimia, possible underlying anxiety and eating disorder. Still not consenting to EGD, GI will plan to treat empirically with PPI. - Patient opted out of the EGD procedure for now - Given one dose of IV feraheme on 04/19/20, will give additional dose today - Continue PPI - daily CBC - daily BMP - started on regular diet   Hypoglycemia Patient with no history of diabetes or on any diabetic medications. Obtained serum glucose, serum insulin, sulfonylurea level, and serum c-peptide to evaluate for endogenous vs exogenous causes of hypoglycemia. After adding IV dextrose, patient's CBGs have been 101 > 95 > 92. Possibly due to depleted glycogen stores with chronic malnutrition. - serum insulin (<.4) and c-peptide (0.9) were appropriately low  - f/u sulfonylurea level (pending) - IV dextrose for CBG <70   Left arm flaccid paralysis Weakness of both legs She reports she has been unable to move left arm or left leg for the past 6 months. CT head was negative for acute intracranial abnormality. MRI brain and c-spine were also negative. Spoke with Dr. Leonel Ramsay (Neurology), he believes that there is nothing to do in the inpatient setting. He advised that patient may benefit from EMG and nerve conduction studies as an outpatient. - PT/OT eval, recommending CIR   Pressure Injury Presumed coccyx medial stage 2 - partial thickness loss of dermis noted as an open injury with a red-pink wound bed without sloughing. On examination, it does not appear to be a stage 2 ulcer, however it was tender to touch - wound care was consulted. We will continue to monitor it.  - Dressing to be changed every 3 days, unless soiled  Chronic Malnutrition Husband reports that patient has not been eating well for past 2 years. She reports no intake for past week before admission. At high risk for refeeding syndrome.  - albumin 2.1 > 1.7 > 1.5 - urinalysis showing  proteinuria, ketonuria - protein-to-creatinine ratio 1.15 - IV thiamine - folate, MVA after off NPO status  - B12 level wnl, folate 4.9 - Daily Chem10 to watch for refeeding syndrome, will replete electrolytes   Prior to Admission Living Arrangement: Home Anticipated Discharge Location: TBD Barriers to Discharge: continued medical management Dispo: TBD    LOS: 4 days   Gabrielle Singh, Medical Student 04/19/2020, 11:44 AM

## 2020-04-19 NOTE — Hospital Course (Addendum)
Issues Addressed:  Left-sided abdominal pain: CT abd/pel showing suspected non-perforated posterolateral wall duodenal bulb ulcer with prominent fluid within pancreaticoduodenal groove, concerning for severe ulceration vs pancreatitis vs malignancy in distal stomach or proximal duodenum. Patient appears to have an underlying undiagnosed psychiatric illness that may be contributing to her current presentation. She does have a reported history of bulimia, possible underlying anxiety and eating disorder. GI was consulted and recommended an upper EGD, however the patient refused consent for the procedure. Per GI's recommendation she is being treated empirically with Protonix 40 mg BID. Patient is not complaining of abdominal pain. Overall the recommendation is follow up outpatient with her PCP. She should consider an upper EGD at a later date, or alternatively obtain a repeat CT scan in a month or so.  Iron deficiency anemia: Clear iron deficiency with ferritin 6, iron 15. Concern for GI bleeding, possibly PUD vs malignancy. Other possibility would be malnutrition or malabsorption. Patient received IV iron x2 and 1 unit pRBCs with appropriate response. Hemoglobin has since remained stable, but still lower than normal values. Hgb at discharge is 9.0., LDH is 93, and haptoglobin is 94.   Hypoglycemia: Patient with no history of diabetes or on any diabetic medications. Possibly due to depleted glycogen stores with chronic malnutrition. Patient's CBGs have been inconsistent with PO intake and have required supplementation with IV dextrose for CBGs <70. We considered the possibility of insulinoma, and exogenous insulin use, and ordered lab values for insulin (<0.4), C-peptide (0.9), and sulfonylurea (still pending). We screened for adrenal insufficieny however, unlikely provided her hypokalemia and her AM cortisol level was 12.1 (WNL). It is our recommendation that the patient follow up with outpatient endocrinology for  further assessment.   Moderate Malnutrition (Hypokalemia, hypomagnesemia): Husband reports that patient has not been eating well for past 2 years. She reports no intake for past week before admission. At high risk for refeeding syndrome. Her hepatic panel was significant for decreased  albumin 1.4 and total protein 5.2, otherwise normal. Urine analysis was significant for proteinuria and ketonuria. We supplemented her with IV thiamine, folate, MVA, Kcl and Mg. Her potassium and magnesium levels have have remained low despite repletion. Her electrolytes should continued to be monitored after discharge. Our recommendation is that the patient consider outpatient resources for eating disorders, including working with a therapist and nutritionist to improve her quality of life.    Left arm flaccid paralysis & weakness of both legs:  She reports she has been unable to move left arm or left leg for the past 6 months. CT head was negative for acute intracranial abnormality. MRI brain and c-spine were also negative. We consulted with Dr. Leonel Ramsay from Neurology, and he believes that there is nothing to do in the inpatient setting. He advised that patient may benefit from EMG and nerve conduction studies as an outpatient. Our PT/OT colleagues worked with her to improve strength and functionality. Their recommendation was that the patient needs 24 hours assistance and would benefit from inpatient rehabilitation.    Pressure Injury: Wound care was consulted and found a stage 1 nonblanchable coccyx wound over her bony prominence. It was measured as 1 cm x 0.8 cm. We monitored for any worsening and had dressing changed every 3 days, unless soiled.

## 2020-04-19 NOTE — Progress Notes (Signed)
Consent is bedside, per patient has not signed because husband want to talk to Dr.  Aletha Halim make Dr aware when husband arrives.

## 2020-04-20 DIAGNOSIS — E440 Moderate protein-calorie malnutrition: Secondary | ICD-10-CM | POA: Insufficient documentation

## 2020-04-20 LAB — HEPATIC FUNCTION PANEL
ALT: 16 U/L (ref 0–44)
AST: 40 U/L (ref 15–41)
Albumin: 1.4 g/dL — ABNORMAL LOW (ref 3.5–5.0)
Alkaline Phosphatase: 72 U/L (ref 38–126)
Bilirubin, Direct: 0.1 mg/dL (ref 0.0–0.2)
Indirect Bilirubin: 0.7 mg/dL (ref 0.3–0.9)
Total Bilirubin: 0.8 mg/dL (ref 0.3–1.2)
Total Protein: 5.2 g/dL — ABNORMAL LOW (ref 6.5–8.1)

## 2020-04-20 LAB — CBC
HCT: 28.1 % — ABNORMAL LOW (ref 36.0–46.0)
Hemoglobin: 8.6 g/dL — ABNORMAL LOW (ref 12.0–15.0)
MCH: 27.6 pg (ref 26.0–34.0)
MCHC: 30.6 g/dL (ref 30.0–36.0)
MCV: 90.1 fL (ref 80.0–100.0)
Platelets: 262 10*3/uL (ref 150–400)
RBC: 3.12 MIL/uL — ABNORMAL LOW (ref 3.87–5.11)
RDW: 20.5 % — ABNORMAL HIGH (ref 11.5–15.5)
WBC: 4.8 10*3/uL (ref 4.0–10.5)
nRBC: 0 % (ref 0.0–0.2)

## 2020-04-20 LAB — PHOSPHORUS: Phosphorus: 4.2 mg/dL (ref 2.5–4.6)

## 2020-04-20 LAB — BASIC METABOLIC PANEL
Anion gap: 7 (ref 5–15)
BUN: 5 mg/dL — ABNORMAL LOW (ref 6–20)
CO2: 26 mmol/L (ref 22–32)
Calcium: 8.2 mg/dL — ABNORMAL LOW (ref 8.9–10.3)
Chloride: 107 mmol/L (ref 98–111)
Creatinine, Ser: 0.59 mg/dL (ref 0.44–1.00)
GFR, Estimated: >60 mL/min (ref >60)
Glucose, Bld: 93 mg/dL (ref 70–99)
Potassium: 3.2 mmol/L — ABNORMAL LOW (ref 3.5–5.1)
Sodium: 140 mmol/L (ref 135–145)

## 2020-04-20 LAB — GLUCOSE, CAPILLARY
Glucose-Capillary: 70 mg/dL (ref 70–99)
Glucose-Capillary: 82 mg/dL (ref 70–99)
Glucose-Capillary: 137 mg/dL — ABNORMAL HIGH (ref 70–99)
Glucose-Capillary: 107 mg/dL — ABNORMAL HIGH (ref 70–99)

## 2020-04-20 LAB — MAGNESIUM: Magnesium: 1.5 mg/dL — ABNORMAL LOW (ref 1.7–2.4)

## 2020-04-20 MED ORDER — ENSURE ENLIVE PO LIQD
237.0000 mL | Freq: Three times a day (TID) | ORAL | Status: DC
  Administered 2020-04-20 – 2020-04-21 (×3): 237 mL via ORAL
  Filled 2020-04-20 (×3): qty 237

## 2020-04-20 MED ORDER — MAGNESIUM SULFATE 2 GM/50ML IV SOLN
2.0000 g | Freq: Once | INTRAVENOUS | Status: AC
  Administered 2020-04-20: 2 g via INTRAVENOUS
  Filled 2020-04-20: qty 50

## 2020-04-20 MED ORDER — POTASSIUM CHLORIDE CRYS ER 20 MEQ PO TBCR
40.0000 meq | EXTENDED_RELEASE_TABLET | Freq: Two times a day (BID) | ORAL | Status: DC
  Administered 2020-04-20 (×2): 40 meq via ORAL
  Filled 2020-04-20 (×2): qty 2

## 2020-04-20 NOTE — Evaluation (Signed)
Occupational Therapy Evaluation Patient Details Name: Gabrielle Singh MRN: [id number] DOB: [birth year] Today's Date: 04/20/2020    History of Present Illness Pt is a 34 y/o female with no pertinent PMH presenting with L sided pain and weakness for the past 3 days. 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 CT and MRI negative.    Clinical Impression   PTA patient reports needing assist for mobility and ADLs from spouse, for the last 3 months (progressively worsening).  Admitted for above and limited by problem list below, including anxiety, impaired balance, L sided weakness and decreased functional use, decreased activity tolerance. She requires total assist +2 for bed mobility, max assist +2 to maintain sitting balance at EOB with poor tolerance due to highly fearful of falling and strong posterior lean, ADLs with min-total assist +2.  See below for details on L UE strength, AROM at shoulder elevation/retraction and triceps extension and activation noted at shoulder flexion, elbow flexion, finger flexion/extension minimally.  She will benefit from continued OT services while admitted and after dc at SNF level to decrease burden of care and promote independence with ADLs/mobility. (Pt does not qualify for CIR).     Follow Up Recommendations  Supervision/Assistance - 24 hour;SNF    Equipment Recommendations  3 in 1 bedside commode    Recommendations for Other Services       Precautions / Restrictions Precautions Precautions: Fall Restrictions Weight Bearing Restrictions: No      Mobility Bed Mobility Overal bed mobility: Needs Assistance Bed Mobility: Rolling;Supine to Sit;Sit to Supine Rolling: Mod assist;Total assist   Supine to sit: Total assist;+2 for physical assistance Sit to supine: Total assist;+2 for physical assistance   General bed mobility comments: needs ModA rolling to the left, totalA rolling to the right; required  totalAx2 to get to EOB and with extreme anxiety/posterior lean needing max emotional encouragement and support. Only able to sit about 2 minutes due to anxiety.    Transfers                 General transfer comment: unable    Balance Overall balance assessment: Needs assistance Sitting-balance support: Feet supported Sitting balance-Leahy Scale: Poor   Postural control: Posterior lean   Standing balance-Leahy Scale: Zero                             ADL either performed or assessed with clinical judgement   ADL Overall ADL's : Needs assistance/impaired     Grooming: Minimal assistance;Bed level   Upper Body Bathing: Moderate assistance;Bed level   Lower Body Bathing: Total assistance;Sitting/lateral leans;Bed level;+2 for physical assistance;+2 for safety/equipment   Upper Body Dressing : Maximal assistance;Sitting Upper Body Dressing Details (indicate cue type and reason): +2 required EOB  Lower Body Dressing: Total assistance;+2 for physical assistance;+2 for safety/equipment;Sitting/lateral leans;Bed level     Toilet Transfer Details (indicate cue type and reason): deferred         Functional mobility during ADLs: Total assistance;+2 for physical assistance;+2 for safety/equipment General ADL Comments: pt limited by L sided weakness, impaired balance, decreased activity tolerance and anxiety      Vision Baseline Vision/History: No visual deficits Patient Visual Report: No change from baseline Vision Assessment?: No apparent visual deficits     Perception     Praxis      Pertinent Vitals/Pain Pain Assessment: Faces Pain Score: 5  Faces Pain Scale: Hurts little more  Pain Location: L LE/foot  Pain Descriptors / Indicators: Other (Comment) ("foot turned the wrong way" ) Pain Intervention(s): Limited activity within patient's tolerance;Monitored during session;Repositioned     Hand Dominance Right   Extremity/Trunk Assessment Upper  Extremity Assessment Upper Extremity Assessment: LUE deficits/detail LUE Deficits / Details: demonstrates 3-/5 shoulder elevation, retraction, elbow extension; 1/5 shoulder flexion, elbow flexion; 2-/5 finger flexion/extension; 0/5 wrist  LUE Sensation: decreased light touch;decreased proprioception LUE Coordination: decreased fine motor;decreased gross motor   Lower Extremity Assessment Lower Extremity Assessment: Defer to PT evaluation       Communication Communication Communication: No difficulties   Cognition Arousal/Alertness: Awake/alert Behavior During Therapy: Anxious Overall Cognitive Status: No family/caregiver present to determine baseline cognitive functioning Area of Impairment: Safety/judgement;Problem solving;Following commands                       Following Commands: Follows one step commands inconsistently;Follows one step commands with increased time Safety/Judgement: Decreased awareness of safety;Decreased awareness of deficits   Problem Solving: Slow processing;Difficulty sequencing;Requires verbal cues;Requires tactile cues;Decreased initiation General Comments: pt extremely anxious with EOB activity, follows simple commands but poor initation, sequencing and problem solving    General Comments  extremely anxious    Exercises     Shoulder Instructions      Home Living Family/patient expects to be discharged to:: Private residence Living Arrangements: Spouse/significant other;Children Available Help at Discharge: Family;Available 24 hours/day Type of Home: House Home Access: Level entry     Home Layout: Multi-level;Bed/bath upstairs Alternate Level Stairs-Number of Steps: 12 Alternate Level Stairs-Rails: Right Bathroom Shower/Tub: Teacher, early years/pre: Standard Bathroom Accessibility: Yes   Home Equipment: Walker - 2 wheels;Shower seat;Bedside commode   Additional Comments: pt reports spending most time upstairs, kids 8 and  10      Prior Functioning/Environment Level of Independence: Needs assistance  Gait / Transfers Assistance Needed: spouse assists walking with RW, carrying as needed for the last 3 months   ADL's / Homemaking Assistance Needed: reports spouse assists as needed, but has been managing using 1 handed techniques with ADLs             OT Problem List: Decreased strength;Decreased range of motion;Decreased activity tolerance;Impaired balance (sitting and/or standing);Decreased coordination;Decreased cognition;Decreased safety awareness;Decreased knowledge of use of DME or AE;Decreased knowledge of precautions;Impaired sensation;Impaired tone;Obesity;Impaired UE functional use;Pain      OT Treatment/Interventions: Self-care/ADL training;Neuromuscular education;DME and/or AE instruction;Therapeutic activities;Patient/family education;Balance training;Cognitive remediation/compensation    OT Goals(Current goals can be found in the care plan section) Acute Rehab OT Goals Patient Stated Goal: "I want to walk" OT Goal Formulation: With patient Time For Goal Achievement: 05/04/20 Potential to Achieve Goals: Fair  OT Frequency: Min 3X/week   Barriers to D/C:            Co-evaluation PT/OT/SLP Co-Evaluation/Treatment: Yes Reason for Co-Treatment: For patient/therapist safety;To address functional/ADL transfers;Complexity of the patient's impairments (multi-system involvement);Necessary to address cognition/behavior during functional activity   OT goals addressed during session: ADL's and self-care      AM-PAC OT "6 Clicks" Daily Activity     Outcome Measure Help from another person eating meals?: A Little Help from another person taking care of personal grooming?: A Little Help from another person toileting, which includes using toliet, bedpan, or urinal?: Total Help from another person bathing (including washing, rinsing, drying)?: A Lot Help from another person to put on and taking off  regular upper body clothing?: A Lot Help from  another person to put on and taking off regular lower body clothing?: Total 6 Click Score: 12   End of Session Nurse Communication: Mobility status  Activity Tolerance: Other (comment) (limited by anxiety ) Patient left: in bed;with call bell/phone within reach;with bed alarm set;Other (comment) (bed in chair position )  OT Visit Diagnosis: Other abnormalities of gait and mobility (R26.89);Muscle weakness (generalized) (M62.81);Pain;Other symptoms and signs involving the nervous system (R29.898);Other symptoms and signs involving cognitive function Pain - Right/Left: Left Pain - part of body: Ankle and joints of foot                Time: 1034-1110 OT Time Calculation (min): 36 min Charges:  OT General Charges $OT Visit: 1 Visit OT Evaluation $OT Eval Moderate Complexity: 1 Mod  Jolaine Artist, OT Acute Rehabilitation Services Pager [phone] Office [phone]   Delight Stare 04/20/2020, 1:31 PM

## 2020-04-20 NOTE — Progress Notes (Addendum)
Nutrition Follow-up  DOCUMENTATION CODES:   Non-severe (moderate) malnutrition in context of social or environmental circumstances  INTERVENTION:   D/c Boost Breeze  Ensure Enlive po TID, each supplement provides 350 kcal and 20 grams of protein  Snacks TID  Continue 40m Prosource Plus po BID, each supplement provides 100 kcal and 15 grams protein  Recommend liberalizing diet to regular (discussed with MD)  Recommend outpatient RD follow up to discuss eating patterns   NUTRITION DIAGNOSIS:   Moderate Malnutrition related to social / environmental circumstances as evidenced by moderate fat depletion, moderate muscle depletion, mild muscle depletion.  ongoing  GOAL:   Patient will meet greater than or equal to 90% of their needs  Not met  MONITOR:   PO intake, Supplement acceptance, Diet advancement, Skin, Weight trends, Labs, I & O's  REASON FOR ASSESSMENT:   Consult Assessment of nutrition requirement/status  ASSESSMENT:   Pt with no PMH presented with L-sided pain and weakness for the past 3 days. 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  Pt states that her appetite in the hospital hasn't been great as she does not think the food has much flavor (documented as 0-50% completion, mostly 0%). Discussed liberalizing diet with MD in hopes of increasing pt's intake. Pt's family has been bringing food for her to enjoy, but they are not always available to bring her meals. Pt states that she does not like the Boost Breeze she has been receiving TID and would prefer Ensure. Pt drinks Ensure Plus at home. Of note, pt did mention to this RD that she periodically goes several days without eating due to fear of becoming fat. Discussed this with MD as well. Offered support for pt. Discussed importance of adequate kcal/protein intake. Consider outpatient dietitian referral for pt to discuss potential disordered eating patterns.  Labs: K+  3.2 (L), Mg 1.5 (L) Medications: 377mProsource Plus BID, Folvite, MVI, Protonix, Miralax, Klor-con, Thiamine  NUTRITION - FOCUSED PHYSICAL EXAM:    Most Recent Value  Orbital Region No depletion  Upper Arm Region Moderate depletion  Thoracic and Lumbar Region Severe depletion  Buccal Region No depletion  Temple Region Mild depletion  Clavicle Bone Region Moderate depletion  Clavicle and Acromion Bone Region Moderate depletion  Scapular Bone Region Severe depletion  Dorsal Hand Moderate depletion  Patellar Region Moderate depletion  Anterior Thigh Region Moderate depletion  Posterior Calf Region Moderate depletion  Edema (RD Assessment) None  Hair Reviewed  Eyes Reviewed  Mouth Reviewed  Skin Reviewed  Nails Reviewed       Diet Order:   Diet Order            Diet Heart Room service appropriate? Yes; Fluid consistency: Thin  Diet effective now                 EDUCATION NEEDS:   No education needs have been identified at this time  Skin:  Skin Assessment: Skin Integrity Issues: Skin Integrity Issues:: Stage II Stage II: coccyx  Last BM:  10/24  Height:   Ht Readings from Last 1 Encounters:  04/16/20 5' 4"  (1.626 m)    Weight:   Wt Readings from Last 1 Encounters:  04/18/20 49.6 kg    BMI:  Body mass index is 18.77 kg/m.  Estimated Nutritional Needs:   Kcal:  174696-2952Protein:  85-100 grams  Fluid:  >/=1.75L/d    AmLarkin InaMS, RD, LDN RD pager number and weekend/on-call  pager number located in [place].

## 2020-04-20 NOTE — Progress Notes (Addendum)
Subjective:  Patient reports she has been eating more. She was able to eat 8 chicken wings and breadstick last night. She reports eating one bite of a hamburger. She did not eat breakfast. When asked why, she said she wants panda express. She had a bowel movement overnight. We discussed her electrolytes have been low and her blood glucose remains low. Our recommendation is to stay in the hospital and continue to be monitored. We encouraged her to eat more throughout the day in hopes that her next BMP will show stability in her electrolytes with regular PO intake.   Objective:  Vital signs in last 24 hours: Vitals:   04/19/20 1638 04/19/20 2146 04/20/20 0658 04/20/20 0926  BP: (!) 92/56 (!) 94/56 (!) 98/59 (!) 93/59  Pulse: 71 83 79 83  Resp: 18 16 18 18   Temp: 98.6 F (37 C) 98.5 F (36.9 C) 98.9 F (37.2 C) 98 F (36.7 C)  TempSrc: Oral Oral Oral Oral  SpO2: 100% 98% (!) 87% 98%  Weight:      Height:       Weight change:   Intake/Output Summary (Last 24 hours) at 04/20/2020 1323 Last data filed at 04/20/2020 0650 Gross per 24 hour  Intake 429.07 ml  Output 0 ml  Net 429.07 ml   Physical Exam Vitals and nursing note reviewed.  Constitutional:      General: She is not in acute distress.    Appearance: She is ill-appearing.  HENT:     Head: Normocephalic and atraumatic.  Cardiovascular:     Rate and Rhythm: Normal rate and regular rhythm.     Pulses: Normal pulses.     Heart sounds: Normal heart sounds. No murmur heard.  No friction rub. No gallop.   Pulmonary:     Effort: Pulmonary effort is normal. No respiratory distress.     Breath sounds: Normal breath sounds.  Abdominal:     General: Abdomen is flat. Bowel sounds are normal. There is no distension.     Palpations: Abdomen is soft.     Tenderness: There is no abdominal tenderness.  Neurological:     Mental Status: She is alert and oriented to person, place, and time.     Motor: Weakness present.      Comments: 4/5 strength in RUE and RLE. 0/5 strength in LUE and LLE. Sensation intact in bilateral lower extremities and RUE. Sensation absent distal to the wrist in LUE, but present proximal to wrist.  Psychiatric:        Attention and Perception: Attention and perception normal.        Mood and Affect: Mood is anxious.        Speech: Speech normal.        Behavior: Behavior is cooperative.      Assessment/Plan:  Principal Problem:   Acute on chronic blood loss anemia Active Problems:   Abdominal pain   Pressure injury of skin   Left arm weakness   Iron deficiency anemia   Hypoglycemia   Abnormal CT scan, gastrointestinal tract   34 yo woman with no PMH other than bulimia as a teenager and 2 normal pregnancies who presented with acute left-sided abdominal/flank pain, poor appetite/eating, and 6 months of progressive weakness in her left arm and both legs. No medical care in past 8 years. Hospital course has been significant for recurrent hypoglycemia, progressive anemia requiring transfusion, and anxiety about hospitalization and medical procedures.    Iron deficiency anemia 2/2 blood loss  anemia Clear iron deficiency with ferritin 6, iron 15. Concern for GI bleeding, possibly PUD vs malignancy. Other possibility would be malnutrition or malabsorption. Retic index is low, likely hypoproliferation due to iron deficiency. Received IV iron x2 and 1 unit pRBCs with appropriate response, Hgb today 8.6. LDH 93, haptoglobin 94 - likely not hemolysis. Still not consenting to EGD. -Daily CBC -Given IV feraheme on 10/24 and 04/19/20  Left-sided abdominal pain CT abd/pel showing suspected non-perforated posterolateral wall duodenal bulb ulcer with prominent fluid within pancreaticoduodenal groove, concerning for severe ulceration vs pancreatitis vs malignancy in distal stomach or proximal duodenum. Patient appears to have an underlying undiagnosed psychiatric illness that may be contributing to  her current presentation. She does have a reported history of bulimia, possible underlying anxiety and eating disorder. Still not consenting to EGD, GI will plan to treat empirically with PPI. Patient is not complaining of abdominal pain. - Continue PPI 40 mg BID - daily CBC - daily BMP - Seems to be tolerating regular diet   Hypoglycemia Patient with no history of diabetes or on any diabetic medications. Patient's CBGs have been 102 > 115 > 70. Possibly due to depleted glycogen stores with chronic malnutrition. IV with Dextrose was switched to NS yesterday, and her CBG dropped to 70. Patient was informed that if it continues to drop she will require Dextrose to be added again.  - f/u sulfonylurea level (pending) - f/u AM cortisol for screening of adrenal insufficiency (though unlikely with hypokalemia) - IV dextrose for CBG <70   Severe Malnutrition Hypokalemia, hypomagnesemia Husband reports that patient has not been eating well for past 2 years. She reports no intake for past week before admission. At high risk for refeeding syndrome.  - albumin 2.1 > 1.7 > 1.5 - urinalysis showing proteinuria, ketonuria - protein-to-creatinine ratio 1.15 - IV thiamine - folate, MVA after off NPO status  - B12 level wnl, folate 4.9 - Daily Chem10 to watch for refeeding syndrome, will replete electrolytes - Giving kcl and Mg today    Left arm flaccid paralysis Weakness of both legs She reports she has been unable to move left arm or left leg for the past 6 months. CT head was negative for acute intracranial abnormality. MRI brain and c-spine were also negative. Spoke with Dr. Leonel Ramsay (Neurology), he believes that there is nothing to do in the inpatient setting. He advised that patient may benefit from EMG and nerve conduction studies as an outpatient. - PT/OT recommends continued services while admited and after dc at SNF level to decreased burden of care and promoted independence with  ADLs/mobility - Order heel protector for better foot positioning to help with pain on left foot - F/U CK lab   Pressure Injury Wound care was consulted and determined a stage 1 nonblanchable coccyx wound over bony prominence. Measurement 1 cm x 0.8 cm. We will continue to monitor it.  - Dressing to be changed every 3 days, unless soiled   Prior to Admission Living Arrangement: Home Anticipated Discharge Location: TBD Barriers to Discharge: continued medical management Dispo: TBD    LOS: 5 days   Lytle Michaels, Medical Student 04/20/2020, 1:23 PM

## 2020-04-20 NOTE — Progress Notes (Signed)
Physical Therapy Treatment Patient Details Name: Gabrielle Singh MRN: [id number] DOB: July 30, 1985 Today's Date: 04/20/2020    History of Present Illness is a 34 y.o. female who presents to the Emergency Department complaining of weakness and abdominal pain. She presents the emergency department for evaluation of left sided weakness as well as abdominal pain. Pt given past possible RA diagnosis via telehealth wtih no neurology follow up noted    PT Comments    Patient received in bed, pleasant but very anxious. PT applied ACE wrap to L ankle to reduce plantarflexion and inversion with moderate success. Needed heavy levels of assist for all functional mobility today, limited by gross weakness and extreme anxiety today; with strong posterior lean at EOB and needed multiple attempts but able to get EOB with totalAx2, only able to maintain for about 2 minutes before needing to return to supine due to anxiety levels. Did note muscle activation and movement in hip rotations, ABD/ADD groups, and quad when patient was not paying attention to that leg, then leg went limp again when she was paying attention to it. Left in bed in chair position with all needs met, bed alarm active. Unfortunately not eligible for CIR- PT reccs updated as appropriate.     Follow Up Recommendations  SNF;Supervision/Assistance - 24 hour     Equipment Recommendations  Wheelchair (measurements PT);Wheelchair cushion (measurements PT);Hospital bed;Other (comment) (hoyer lift)    Recommendations for Other Services       Precautions / Restrictions Precautions Precautions: Fall Restrictions Weight Bearing Restrictions: No    Mobility  Bed Mobility Overal bed mobility: Needs Assistance Bed Mobility: Rolling;Supine to Sit;Sit to Supine Rolling: Mod assist;Total assist   Supine to sit: Total assist;+2 for physical assistance Sit to supine: Total assist;+2 for physical assistance   General bed mobility comments: needs  ModA rolling to the left, totalA rolling to the right; required totalAx2 to get to EOB and with extreme anxiety/posterior lean needing max emotional encouragement and support. Only able to sit about 2 minutes due to anxiety.  Transfers                 General transfer comment: unable  Ambulation/Gait             General Gait Details: unable   Stairs             Wheelchair Mobility    Modified Rankin (Stroke Patients Only)       Balance Overall balance assessment: Needs assistance Sitting-balance support: Feet supported Sitting balance-Leahy Scale: Poor   Postural control: Posterior lean   Standing balance-Leahy Scale: Zero                              Cognition Arousal/Alertness: Awake/alert Behavior During Therapy: WFL for tasks assessed/performed Overall Cognitive Status: No family/caregiver present to determine baseline cognitive functioning Area of Impairment: Safety/judgement;Problem solving;Following commands                       Following Commands: Follows one step commands inconsistently;Follows one step commands with increased time Safety/Judgement: Decreased awareness of safety;Decreased awareness of deficits   Problem Solving: Slow processing;Difficulty sequencing;Requires verbal cues;Requires tactile cues;Decreased initiation General Comments: extremely anxious and seems to have difficulty wtih cue following; states "I can't" often without much or any initiation. However was observed activating muscles we were asking her to use for functional tasks, then stopped when she felt the activation  Exercises      General Comments General comments (skin integrity, edema, etc.): extremely anxious      Pertinent Vitals/Pain Pain Assessment: Faces Pain Score: 5  Faces Pain Scale: Hurts little more Pain Location: L LE/foot  Pain Descriptors / Indicators: Other (Comment) ("foot turned the wrong way") Pain  Intervention(s): Limited activity within patient's tolerance;Monitored during session    [place] expects to be discharged to:: Private residence Living Arrangements: Spouse/significant other;Children Available Help at Discharge: Family;Available 24 hours/day Type of Home: House Home Access: Level entry   Home Layout: Multi-level;Bed/bath upstairs Home Equipment: Walker - 2 wheels;Shower seat;Bedside commode Additional Comments: pt reports spending most time upstairs, kids 8 and 10    Prior Function Level of Independence: Needs assistance  Gait / Transfers Assistance Needed: spouse assists walking with RW, carrying as needed for the last 3 months   ADL's / Homemaking Assistance Needed: reports spouse assists as needed, but has been managing using 1 handed techniques with ADLs      PT Goals (current goals can now be found in the care plan section) Acute Rehab PT Goals Patient Stated Goal: "I want to walk" PT Goal Formulation: With patient/family Time For Goal Achievement: 05/03/20 Potential to Achieve Goals: Fair Progress towards PT goals: Not progressing toward goals - comment (anxiety)    Frequency    Min 4X/week      PT Plan Frequency needs to be updated;Discharge plan needs to be updated;Equipment recommendations need to be updated    Co-evaluation              AM-PAC PT "6 Clicks" Mobility   Outcome Measure  Help needed turning from your back to your side while in a flat bed without using bedrails?: A Lot Help needed moving from lying on your back to sitting on the side of a flat bed without using bedrails?: Total Help needed moving to and from a bed to a chair (including a wheelchair)?: Total Help needed standing up from a chair using your arms (e.g., wheelchair or bedside chair)?: Total Help needed to walk in hospital room?: Total Help needed climbing 3-5 steps with a railing? : Total 6 Click Score: 7    End of Session   Activity  Tolerance: Other (comment) (limited by anxiety) Patient left: in bed;with call bell/phone within reach;with bed alarm set (bed in chair position) Nurse Communication: Mobility status;Other (comment) (anxiety) PT Visit Diagnosis: Other symptoms and signs involving the nervous system (R29.898);Hemiplegia and hemiparesis;Other abnormalities of gait and mobility (R26.89);Unsteadiness on feet (R26.81) Hemiplegia - Right/Left: Left Hemiplegia - dominant/non-dominant: Dominant Hemiplegia - caused by: Unspecified     Time: 7782-4235 PT Time Calculation (min) (ACUTE ONLY): 36 min  Charges:  $Therapeutic Activity: 8-22 mins (co-tx with OT)                     Windell Norfolk, DPT, PN1   Supplemental Physical Therapist [place]    Pager [phone] Acute Rehab Office [phone]

## 2020-04-21 ENCOUNTER — Inpatient Hospital Stay (HOSPITAL_COMMUNITY)
Admission: RE | Admit: 2020-04-21 | Discharge: 2020-04-22 | DRG: 948 | Discharge status: Against Medical Advice | Payer: Self-pay | Source: Intra-hospital | Attending: Physical Medicine & Rehabilitation | Admitting: Physical Medicine & Rehabilitation

## 2020-04-21 ENCOUNTER — Other Ambulatory Visit: Payer: Self-pay

## 2020-04-21 ENCOUNTER — Encounter (HOSPITAL_COMMUNITY): Payer: Self-pay | Admitting: Physical Medicine & Rehabilitation

## 2020-04-21 DIAGNOSIS — M069 Rheumatoid arthritis, unspecified: Secondary | ICD-10-CM | POA: Diagnosis present

## 2020-04-21 DIAGNOSIS — R109 Unspecified abdominal pain: Secondary | ICD-10-CM | POA: Diagnosis present

## 2020-04-21 DIAGNOSIS — R29898 Other symptoms and signs involving the musculoskeletal system: Secondary | ICD-10-CM

## 2020-04-21 DIAGNOSIS — Z8659 Personal history of other mental and behavioral disorders: Secondary | ICD-10-CM

## 2020-04-21 DIAGNOSIS — R5381 Other malaise: Principal | ICD-10-CM | POA: Diagnosis present

## 2020-04-21 DIAGNOSIS — D508 Other iron deficiency anemias: Secondary | ICD-10-CM

## 2020-04-21 DIAGNOSIS — L89151 Pressure ulcer of sacral region, stage 1: Secondary | ICD-10-CM | POA: Diagnosis present

## 2020-04-21 DIAGNOSIS — Z5329 Procedure and treatment not carried out because of patient's decision for other reasons: Secondary | ICD-10-CM | POA: Diagnosis not present

## 2020-04-21 DIAGNOSIS — F419 Anxiety disorder, unspecified: Secondary | ICD-10-CM | POA: Diagnosis present

## 2020-04-21 DIAGNOSIS — G8194 Hemiplegia, unspecified affecting left nondominant side: Secondary | ICD-10-CM | POA: Diagnosis present

## 2020-04-21 DIAGNOSIS — D509 Iron deficiency anemia, unspecified: Secondary | ICD-10-CM | POA: Diagnosis present

## 2020-04-21 DIAGNOSIS — R627 Adult failure to thrive: Secondary | ICD-10-CM | POA: Diagnosis present

## 2020-04-21 LAB — MAGNESIUM: Magnesium: 1.8 mg/dL (ref 1.7–2.4)

## 2020-04-21 LAB — CBC
HCT: 30.1 % — ABNORMAL LOW (ref 36.0–46.0)
Hemoglobin: 9 g/dL — ABNORMAL LOW (ref 12.0–15.0)
MCH: 27.5 pg (ref 26.0–34.0)
MCHC: 29.9 g/dL — ABNORMAL LOW (ref 30.0–36.0)
MCV: 92 fL (ref 80.0–100.0)
Platelets: 281 10*3/uL (ref 150–400)
RBC: 3.27 MIL/uL — ABNORMAL LOW (ref 3.87–5.11)
RDW: 21 % — ABNORMAL HIGH (ref 11.5–15.5)
WBC: 4.7 10*3/uL (ref 4.0–10.5)
nRBC: 0 % (ref 0.0–0.2)

## 2020-04-21 LAB — BASIC METABOLIC PANEL
Anion gap: 5 (ref 5–15)
BUN: 6 mg/dL (ref 6–20)
CO2: 29 mmol/L (ref 22–32)
Calcium: 8.5 mg/dL — ABNORMAL LOW (ref 8.9–10.3)
Chloride: 104 mmol/L (ref 98–111)
Creatinine, Ser: 0.52 mg/dL (ref 0.44–1.00)
GFR, Estimated: >60 mL/min (ref >60)
Glucose, Bld: 81 mg/dL (ref 70–99)
Potassium: 3.4 mmol/L — ABNORMAL LOW (ref 3.5–5.1)
Sodium: 138 mmol/L (ref 135–145)

## 2020-04-21 LAB — GLUCOSE, CAPILLARY
Glucose-Capillary: 59 mg/dL — ABNORMAL LOW (ref 70–99)
Glucose-Capillary: 84 mg/dL (ref 70–99)
Glucose-Capillary: 77 mg/dL (ref 70–99)
Glucose-Capillary: 77 mg/dL (ref 70–99)

## 2020-04-21 LAB — CORTISOL-AM, BLOOD: Cortisol - AM: 12.1 ug/dL (ref 6.7–22.6)

## 2020-04-21 MED ORDER — POTASSIUM CHLORIDE CRYS ER 20 MEQ PO TBCR
40.0000 meq | EXTENDED_RELEASE_TABLET | Freq: Two times a day (BID) | ORAL | Status: DC
  Administered 2020-04-21: 40 meq via ORAL
  Filled 2020-04-21 (×2): qty 2

## 2020-04-21 MED ORDER — PROSOURCE PLUS PO LIQD: 30.0000 mL | Freq: Two times a day (BID) | ORAL | Status: AC

## 2020-04-21 MED ORDER — THIAMINE HCL 100 MG PO TABS: 100.0000 mg | ORAL_TABLET | Freq: Every day | ORAL | Status: AC

## 2020-04-21 MED ORDER — PANTOPRAZOLE SODIUM 40 MG PO TBEC
40.0000 mg | DELAYED_RELEASE_TABLET | Freq: Two times a day (BID) | ORAL | Status: DC
  Administered 2020-04-21: 40 mg via ORAL
  Filled 2020-04-21 (×2): qty 1

## 2020-04-21 MED ORDER — PROSOURCE PLUS PO LIQD
30.0000 mL | Freq: Two times a day (BID) | ORAL | Status: DC
  Administered 2020-04-22: 30 mL via ORAL
  Filled 2020-04-21: qty 30

## 2020-04-21 MED ORDER — ENSURE ENLIVE PO LIQD: 237.0000 mL | Freq: Three times a day (TID) | ORAL | 12 refills | Status: AC

## 2020-04-21 MED ORDER — ENSURE ENLIVE PO LIQD
237.0000 mL | Freq: Three times a day (TID) | ORAL | Status: DC
  Administered 2020-04-21: 237 mL via ORAL

## 2020-04-21 MED ORDER — GLUCOSE 40 % PO GEL
ORAL | Status: AC
  Administered 2020-04-21: 37.5 g
  Filled 2020-04-21: qty 1

## 2020-04-21 MED ORDER — ADULT MULTIVITAMIN W/MINERALS CH: 1.0000 | ORAL_TABLET | Freq: Every day | ORAL | Status: AC

## 2020-04-21 MED ORDER — POLYETHYLENE GLYCOL 3350 17 G PO PACK
17.0000 g | PACK | Freq: Every day | ORAL | Status: DC
  Filled 2020-04-21: qty 1

## 2020-04-21 MED ORDER — THIAMINE HCL 100 MG PO TABS
100.0000 mg | ORAL_TABLET | Freq: Every day | ORAL | Status: DC
  Filled 2020-04-21: qty 1

## 2020-04-21 MED ORDER — THIAMINE HCL 100 MG/ML IJ SOLN
100.0000 mg | Freq: Every day | INTRAMUSCULAR | Status: DC
  Filled 2020-04-21: qty 1

## 2020-04-21 MED ORDER — PANTOPRAZOLE SODIUM 40 MG PO TBEC: 40.0000 mg | DELAYED_RELEASE_TABLET | Freq: Two times a day (BID) | ORAL | Status: AC

## 2020-04-21 MED ORDER — ADULT MULTIVITAMIN W/MINERALS CH
1.0000 | ORAL_TABLET | Freq: Every day | ORAL | Status: DC
  Filled 2020-04-21: qty 1

## 2020-04-21 MED ORDER — FOLIC ACID 1 MG PO TABS
1.0000 mg | ORAL_TABLET | Freq: Every day | ORAL | Status: DC
  Filled 2020-04-21: qty 1

## 2020-04-21 MED ORDER — POLYETHYLENE GLYCOL 3350 17 G PO PACK: 17.0000 g | PACK | Freq: Every day | ORAL | 0 refills | Status: AC

## 2020-04-21 MED ORDER — FOLIC ACID 1 MG PO TABS: 1.0000 mg | ORAL_TABLET | Freq: Every day | ORAL | Status: AC

## 2020-04-21 NOTE — H&P (Signed)
Physical Medicine and Rehabilitation Admission H&P    Chief Complaint  Patient presents with  . Weakness  . Abdominal Pain  : HPI: Gabrielle Singh is a 34 year old right-handed female with history of bulimia as a teenager, rheumatoid arthritis that reportedly was diagnosed during a telehealth visit, chronic anemia on no prescription medications and has not received any medical care in the past 8 years.  Presented 04/15/2020 with left-sided abdominal pain as well as intermittent left arm and leg weakness.  Patient reported this is progressed over the last 4 months.  Admission chemistries with sodium 134 potassium 3.4 BUN 6 creatinine 0.62, hemoglobin 8.2, lipase 34, TSH within normal limits.  Cranial CT/MRI scan negative for acute changes.  MRI cervical spine unremarkable.  CT angiogram of the chest showed no evidence of pulmonary emboli.  CTA of the abdomen pelvis showed grooved pancreatitis with secondary duodenitis, although felt to be less likely but not excluded.  Thickened proximal duodenum with suspected nonperforated ulceration along the posterior lateral wall of the duodenal bulb with prominent fluid within the pancreaticoduodenal groove.  Gastroenterology follow-up Dr. Loletha Carrow with recommendations of upper endoscopy of which patient declined.  She was placed on PPI 40 mg twice daily.  Tolerating a regular diet.  There was some suggestion symptoms were from underlying undiagnosed psychiatric illness contributing to her current presentation.  Iron deficiency anemia she did receive IV iron x2 and 1 unit packed red blood cells with latest hemoglobin 9.0.  In regards to patient's left arm and leg weakness CT and MRI were negative spoke with neurology services Dr. Leonel Ramsay nothing further to add on inpatient setting consider EMG outpatient.  Wound care consulted for stage I nonblanchable coccyx wound over bony prominence dressing changes as directed.  Tolerating regular diet.  Therapy evaluations  completed with recommendations of physical medicine rehab consult.  Review of Systems  Constitutional: Positive for weight loss.       Decrease in appetite over the last 4 months  HENT: Negative for hearing loss.   Eyes: Negative for blurred vision and double vision.  Respiratory: Negative for cough and shortness of breath.   Cardiovascular: Negative for chest pain, palpitations and leg swelling.  Gastrointestinal: Positive for abdominal pain, nausea and vomiting.  Genitourinary: Negative for dysuria and hematuria.       Irregular menstrual bleeding  Musculoskeletal: Positive for myalgias.  Skin: Negative for rash.  Neurological:       Left arm weakness  Psychiatric/Behavioral: Positive for depression. The patient has insomnia.   All other systems reviewed and are negative.  History reviewed. No pertinent past medical history. History reviewed. No pertinent surgical history. No family history on file. Social History:  reports that she has never smoked. She has never used smokeless tobacco. She reports previous alcohol use. She reports previous drug use. Allergies: No Known Allergies No medications prior to admission.    Drug Regimen Review Drug regimen was reviewed and remains appropriate with no significant issues identified  Home: Home Living Family/patient expects to be discharged to:: Private residence Living Arrangements: Spouse/significant other, Children Available Help at Discharge: Family, Available 24 hours/day Type of Home: House Home Access: Level entry Home Layout: Multi-level, Bed/bath upstairs Alternate Level Stairs-Number of Steps: 12 Alternate Level Stairs-Rails: Right Bathroom Shower/Tub: Chiropodist: Standard Bathroom Accessibility: Yes Home Equipment: Walker - 2 wheels, Shower seat, Bedside commode Additional Comments: pt reports spending most time upstairs, kids 8 and 10   Functional History: Prior Function Level of  Independence:  Needs assistance Gait / Transfers Assistance Needed: spouse assists walking with RW, carrying as needed for the last 3 months   ADL's / Homemaking Assistance Needed: reports spouse assists as needed, but has been managing using 1 handed techniques with ADLs   Functional Status:  Mobility: Bed Mobility Overal bed mobility: Needs Assistance Bed Mobility: Rolling, Supine to Sit, Sit to Supine Rolling: Mod assist, Total assist Supine to sit: Total assist, +2 for physical assistance Sit to supine: Total assist, +2 for physical assistance General bed mobility comments: refused despite multiple attempts Transfers Overall transfer level: Needs assistance Transfers: Squat Pivot Transfers Squat pivot transfers: Mod assist General transfer comment: refused despite multiple attempts Ambulation/Gait General Gait Details: unable    ADL: ADL Overall ADL's : Needs assistance/impaired Grooming: Minimal assistance, Bed level Upper Body Bathing: Moderate assistance, Bed level Lower Body Bathing: Total assistance, Sitting/lateral leans, Bed level, +2 for physical assistance, +2 for safety/equipment Upper Body Dressing : Maximal assistance, Sitting Upper Body Dressing Details (indicate cue type and reason): +2 required EOB  Lower Body Dressing: Total assistance, +2 for physical assistance, +2 for safety/equipment, Sitting/lateral leans, Bed level Toilet Transfer Details (indicate cue type and reason): deferred Functional mobility during ADLs: Total assistance, +2 for physical assistance, +2 for safety/equipment General ADL Comments: pt limited by L sided weakness, impaired balance, decreased activity tolerance and anxiety   Cognition: Cognition Overall Cognitive Status: No family/caregiver present to determine baseline cognitive functioning Orientation Level: Oriented X4 Cognition Arousal/Alertness: Awake/alert Behavior During Therapy: Anxious Overall Cognitive Status: No family/caregiver  present to determine baseline cognitive functioning Area of Impairment: Safety/judgement, Problem solving, Following commands Following Commands: Follows multi-step commands inconsistently, Follows one step commands with increased time Safety/Judgement: Decreased awareness of safety, Decreased awareness of deficits Problem Solving: Slow processing, Difficulty sequencing, Requires verbal cues, Requires tactile cues, Decreased initiation General Comments: resistant to any attempts at mobility; at multiple points in session, closed her eyes and tried to pretend we were not there; very not motivated and needed max coaxing to participate. Kept saying she did not feel good, unable to really give a good answer when I challenged her on what her plan for mobility at home was  Physical Exam: Blood pressure 104/69, pulse 79, temperature 99.1 F (37.3 C), temperature source Oral, resp. rate 16, height 5\' 4"  (1.626 m), weight 49.6 kg, SpO2 95 %. Physical Exam Constitutional:      Appearance: She is ill-appearing.  HENT:     Head: Normocephalic and atraumatic.     Mouth/Throat:     Mouth: Mucous membranes are moist.  Eyes:     Extraocular Movements: Extraocular movements intact.     Pupils: Pupils are equal, round, and reactive to light.  Cardiovascular:     Rate and Rhythm: Normal rate and regular rhythm.     Heart sounds: No murmur heard.  No friction rub. No gallop.   Pulmonary:     Effort: Pulmonary effort is normal. No respiratory distress.     Breath sounds: Normal breath sounds.  Abdominal:     General: Abdomen is flat. Bowel sounds are normal. There is no distension or abdominal bruit.     Palpations: Abdomen is soft.  Musculoskeletal:        General: No swelling.     Comments: Bilateral R>L heel cord tightness, LE muscle wasting  Skin:    General: Skin is warm.     Comments: Small stage 1 on coccyx/sacrum. Foam dressing in place  Neurological:  Mental Status: She is alert and  oriented to person, place, and time.     Comments: Patient is alert in no acute distress.  Mood is a bit flat but appropriate.  Follows commands and oriented x3. RUE 4/5. LUE 1 to 2/5 with some inconsistencies. LE: 1-2/5 prox to distal, Right weaker then left. No focal sensory deficits. DTR's 1+. Hoover's test +/-  Psychiatric:     Comments: Pt flat but cooperative and overall pleasant     Results for orders placed or performed during the hospital encounter of 04/15/20 (from the past 48 hour(s))  Glucose, capillary     Status: Abnormal   Collection Time: 04/19/20 11:24 AM  Result Value Ref Range   Glucose-Capillary 102 (H) 70 - 99 mg/dL    Comment: Glucose reference range applies only to samples taken after fasting for at least 8 hours.  Glucose, capillary     Status: None   Collection Time: 04/19/20  4:35 PM  Result Value Ref Range   Glucose-Capillary 84 70 - 99 mg/dL    Comment: Glucose reference range applies only to samples taken after fasting for at least 8 hours.  Magnesium     Status: Abnormal   Collection Time: 04/19/20  5:30 PM  Result Value Ref Range   Magnesium 1.6 (L) 1.7 - 2.4 mg/dL    Comment: Performed at [facility] [address] [place], [place] [address]  Phosphorus     Status: None   Collection Time: 04/19/20  5:30 PM  Result Value Ref Range   Phosphorus 4.3 2.5 - 4.6 mg/dL    Comment: Performed at [facility] [address] [place],  [address]  Glucose, capillary     Status: Abnormal   Collection Time: 04/19/20  9:17 PM  Result Value Ref Range   Glucose-Capillary 115 (H) 70 - 99 mg/dL    Comment: Glucose reference range applies only to samples taken after fasting for at least 8 hours.  CBC     Status: Abnormal   Collection Time: 04/20/20  1:04 AM  Result Value Ref Range   WBC 4.8 4.0 - 10.5 K/uL   RBC 3.12 (L) 3.87 - 5.11 MIL/uL   Hemoglobin 8.6 (L) 12.0 - 15.0 g/dL   HCT 28.1 (L) 36 - 46 %   MCV 90.1 80.0 - 100.0 fL   MCH 27.6  26.0 - 34.0 pg   MCHC 30.6 30.0 - 36.0 g/dL   RDW 20.5 (H) 11.5 - 15.5 %   Platelets 262 150 - 400 K/uL   nRBC 0.0 0.0 - 0.2 %    Comment: Performed at [facility] [address], [place],  [address]  Basic metabolic panel     Status: Abnormal   Collection Time: 04/20/20  1:04 AM  Result Value Ref Range   Sodium 140 135 - 145 mmol/L   Potassium 3.2 (L) 3.5 - 5.1 mmol/L   Chloride 107 98 - 111 mmol/L   CO2 26 22 - 32 mmol/L   Glucose, Bld 93 70 - 99 mg/dL    Comment: Glucose reference range applies only to samples taken after fasting for at least 8 hours.   BUN 5 (L) 6 - 20 mg/dL   Creatinine, Ser 0.59 0.44 - 1.00 mg/dL   Calcium 8.2 (L) 8.9 - 10.3 mg/dL   GFR, Estimated >60 >60 mL/min    Comment: (NOTE) Calculated using the CKD-EPI Creatinine Equation (2021)    Anion gap  7 5 - 15    Comment: Performed at [facility], [place] [address] [place], [place] [address]  Magnesium     Status: Abnormal   Collection Time: 04/20/20  1:04 AM  Result Value Ref Range   Magnesium 1.5 (L) 1.7 - 2.4 mg/dL    Comment: Performed at [facility] [address], [place], [place] [address]  Phosphorus     Status: None   Collection Time: 04/20/20  1:04 AM  Result Value Ref Range   Phosphorus 4.2 2.5 - 4.6 mg/dL    Comment: Performed at [facility] [address] [place], [place] [address]  Hepatic function panel     Status: Abnormal   Collection Time: 04/20/20  1:04 AM  Result Value Ref Range   Total Protein 5.2 (L) 6.5 - 8.1 g/dL   Albumin 1.4 (L) 3.5 - 5.0 g/dL   AST 40 15 - 41 U/L   ALT 16 0 - 44 U/L   Alkaline Phosphatase 72 38 - 126 U/L   Total Bilirubin 0.8 0.3 - 1.2 mg/dL   Bilirubin, Direct 0.1 0.0 - 0.2 mg/dL   Indirect Bilirubin 0.7 0.3 - 0.9 mg/dL    Comment: Performed at [facility] [address], [place], [place] [address]  Glucose, capillary     Status: None   Collection Time: 04/20/20  7:00 AM  Result Value Ref Range    Glucose-Capillary 70 70 - 99 mg/dL    Comment: Glucose reference range applies only to samples taken after fasting for at least 8 hours.  Glucose, capillary     Status: None   Collection Time: 04/20/20 11:21 AM  Result Value Ref Range   Glucose-Capillary 82 70 - 99 mg/dL    Comment: Glucose reference range applies only to samples taken after fasting for at least 8 hours.  Glucose, capillary     Status: Abnormal   Collection Time: 04/20/20  4:16 PM  Result Value Ref Range   Glucose-Capillary 137 (H) 70 - 99 mg/dL    Comment: Glucose reference range applies only to samples taken after fasting for at least 8 hours.  Glucose, capillary     Status: Abnormal   Collection Time: 04/20/20  9:00 PM  Result Value Ref Range   Glucose-Capillary 107 (H) 70 - 99 mg/dL    Comment: Glucose reference range applies only to samples taken after fasting for at least 8 hours.  CBC     Status: Abnormal   Collection Time: 04/21/20  5:55 AM  Result Value Ref Range   WBC 4.7 4.0 - 10.5 K/uL   RBC 3.27 (L) 3.87 - 5.11 MIL/uL   Hemoglobin 9.0 (L) 12.0 - 15.0 g/dL   HCT 30.1 (L) 36 - 46 %   MCV 92.0 80.0 - 100.0 fL   MCH 27.5 26.0 - 34.0 pg   MCHC 29.9 (L) 30.0 - 36.0 g/dL   RDW 21.0 (H) 11.5 - 15.5 %   Platelets 281 150 - 400 K/uL   nRBC 0.0 0.0 - 0.2 %    Comment: Performed at [facility] [address], [place], [place] [address]  Basic metabolic panel     Status: Abnormal   Collection Time: 04/21/20  5:55 AM  Result Value Ref Range   Sodium 138 135 - 145 mmol/L   Potassium 3.4 (L) 3.5 - 5.1 mmol/L   Chloride 104 98 - 111 mmol/L   CO2 29 22 -  32 mmol/L   Glucose, Bld 81 70 - 99 mg/dL    Comment: Glucose reference range applies only to samples taken after fasting for at least 8 hours.   BUN 6 6 - 20 mg/dL   Creatinine, Ser 0.52 0.44 - 1.00 mg/dL   Calcium 8.5 (L) 8.9 - 10.3 mg/dL   GFR, Estimated >60 >60 mL/min    Comment: (NOTE) Calculated using the CKD-EPI Creatinine Equation (2021)     Anion gap 5 5 - 15    Comment: Performed at [facility] [address] [place], [place] [address]  Cortisol-am, blood     Status: None   Collection Time: 04/21/20  5:55 AM  Result Value Ref Range   Cortisol - AM 12.1 6.7 - 22.6 ug/dL    Comment: Performed at [facility], [place] [address], [place], [place] [address]  Glucose, capillary     Status: Abnormal   Collection Time: 04/21/20  6:43 AM  Result Value Ref Range   Glucose-Capillary 59 (L) 70 - 99 mg/dL    Comment: Glucose reference range applies only to samples taken after fasting for at least 8 hours.  Glucose, capillary     Status: None   Collection Time: 04/21/20  7:22 AM  Result Value Ref Range   Glucose-Capillary 84 70 - 99 mg/dL    Comment: Glucose reference range applies only to samples taken after fasting for at least 8 hours.  Magnesium     Status: None   Collection Time: 04/21/20  9:15 AM  Result Value Ref Range   Magnesium 1.8 1.7 - 2.4 mg/dL    Comment: Performed at [facility] [address] [place], [place] [address]   No results found.     Medical Problem List and Plan: 1.  Debility secondary to iron deficiency anemia and failure to thrive as well as left-sided abdominal pain. Chronic LUE of unknown origin. Neurological exam not entirely consistent.   -patient may  shower  -ELOS/Goals: supervision to min assist, 1 week trial to start 2.  Antithrombotics: -DVT/anticoagulation: SCDs  -antiplatelet therapy: N/A 3. Pain Management: Tylenol as needed 4. Mood: Provide emotional support.     -suspect overlying psychiatric issues impacting functional deficits. Has history of bulimia. Ask Neuropsych to see patient while here  -antipsychotic agents: N/A 5. Neuropsych: This patient is capable of making decisions on her own behalf. 6. Skin/Wound Care: foam dressing, pressure relief, improve nutrition to stage I on coccyx. 7. Fluids/Electrolytes/Nutrition: Routine in and outs with follow-up  chemistries 8.  Left-sided abdominal pain.  Continue PPI.  Patient declined EGD.  Follow-up gastroenterology services as needed 9.  Iron deficiency anemia.  Received IV iron x2 and 1 unit packed red blood cells.  Follow-up CBC 10.  Left arm weakness as well as left leg.  Cranial CT scan MRI negative.  Spoke with neurology services nothing more to add on the inpatient setting.  Consider EMG as outpatient 11.  Decreased nutritional storage.  Dietary follow-up  -team/family encouraging pt to increase intake  -she had an assortment of snacks on tray table in room     Cathlyn Parsons, PA-C 04/21/2020

## 2020-04-21 NOTE — Progress Notes (Signed)
Physical Therapy Treatment Patient Details Name: Gabrielle Singh MRN: [id number] DOB: Mar 28, 1986 Today's Date: 04/21/2020    History of Present Illness Pt is a 34 y/o female with no pertinent PMH presenting with L sided pain and weakness for the past 3 days. 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 CT and MRI negative.     PT Comments    Patient received in bed, c/o nausea and stomach pain- RN notified. Spent quite a bit of time this session just educating patient on benefits of mobility and possible resultant pain reduction, also educated in context of improving mobility at home/reducing burden of care on spouse however she remained resistant and refused even low level mobility at bed level. Was able to convince her to participate in very low level bed exercises including TA squeezes, ankle pumps, and mini-SLRs in bed. Noted quality muscle activation in TA as well as in quad/hip extensors of L LE when performing SLRs with R LE. Extremely self limiting, feel she would possibly benefit from psych consult. Feel she can certainly do much more with her core and L LE than she is allowing herself to perform or believe. Unable to really give a good answer when I challenged her on what her plan is for mobility/increased independence at home. Left in bed with all needs met, bed alarm active. PT frequency reduced, will increase it in the future if consistency with PT improves.    Follow Up Recommendations  SNF;Supervision/Assistance - 24 hour     Equipment Recommendations  Wheelchair (measurements PT);Wheelchair cushion (measurements PT);Hospital bed;Other (comment) (hoyer lift)    Recommendations for Other Services Other (comment) (neuropsych)     Precautions / Restrictions Precautions Precautions: Fall;Other (comment) Precaution Comments: extreme anxiety Restrictions Weight Bearing Restrictions: No    Mobility  Bed Mobility                General bed mobility comments: refused despite multiple attempts  Transfers                 General transfer comment: refused despite multiple attempts  Ambulation/Gait             General Gait Details: unable   Stairs             Wheelchair Mobility    Modified Rankin (Stroke Patients Only)       Balance Overall balance assessment: Needs assistance Sitting-balance support: Feet supported Sitting balance-Leahy Scale: Poor   Postural control: Posterior lean   Standing balance-Leahy Scale: Zero                              Cognition Arousal/Alertness: Awake/alert Behavior During Therapy: Anxious Overall Cognitive Status: No family/caregiver present to determine baseline cognitive functioning Area of Impairment: Safety/judgement;Problem solving;Following commands                       Following Commands: Follows multi-step commands inconsistently;Follows one step commands with increased time Safety/Judgement: Decreased awareness of safety;Decreased awareness of deficits   Problem Solving: Slow processing;Difficulty sequencing;Requires verbal cues;Requires tactile cues;Decreased initiation General Comments: resistant to any attempts at mobility; at multiple points in session, closed her eyes and tried to pretend we were not there; very not motivated and needed max coaxing to participate. Kept saying she did not feel good, unable to really give a good answer when I challenged her on what her plan for  mobility at home was      Exercises      General Comments General comments (skin integrity, edema, etc.): resistant to therapy today      Pertinent Vitals/Pain Pain Assessment: Faces Faces Pain Scale: Hurts little more Pain Location: stomach/nauseous Pain Descriptors / Indicators: Discomfort Pain Intervention(s): Limited activity within patient's tolerance;Monitored during session;Other (comment) (asked RN for nausea meds)     Home Living                      Prior Function            PT Goals (current goals can now be found in the care plan section) Acute Rehab PT Goals Patient Stated Goal: "I want to walk" PT Goal Formulation: With patient/family Time For Goal Achievement: 05/03/20 Potential to Achieve Goals: Fair Progress towards PT goals: Not progressing toward goals - comment (anxiety/resistance to mobility)    Frequency    Min 2X/week      PT Plan Frequency needs to be updated    Co-evaluation              AM-PAC PT "6 Clicks" Mobility   Outcome Measure  Help needed turning from your back to your side while in a flat bed without using bedrails?: A Lot Help needed moving from lying on your back to sitting on the side of a flat bed without using bedrails?: Total Help needed moving to and from a bed to a chair (including a wheelchair)?: Total Help needed standing up from a chair using your arms (e.g., wheelchair or bedside chair)?: Total Help needed to walk in hospital room?: Total Help needed climbing 3-5 steps with a railing? : Total 6 Click Score: 7    End of Session   Activity Tolerance: Other (comment) (limited by nausea)   Nurse Communication: Mobility status;Other (comment) (requesting nausea meds, would likely benefit from anxiety medication at least before therapies) PT Visit Diagnosis: Other symptoms and signs involving the nervous system (R29.898);Hemiplegia and hemiparesis;Other abnormalities of gait and mobility (R26.89);Unsteadiness on feet (R26.81) Hemiplegia - Right/Left: Left Hemiplegia - dominant/non-dominant: Dominant Hemiplegia - caused by: Unspecified     Time: 9409-8286 PT Time Calculation (min) (ACUTE ONLY): 30 min  Charges:  $Therapeutic Exercise: 8-22 mins (co-tx with OT)                     Windell Norfolk, DPT, PN1   Supplemental Physical Therapist [place]    Pager [phone] Acute Rehab Office [phone]

## 2020-04-21 NOTE — Progress Notes (Signed)
DISCHARGE NOTE SN[facility] to be discharged to CIR per MD order. Patient verbalized understanding.  Skin clean, dry and intact without evidence of skin break down, no evidence of skin tears noted. IV catheter discontinued intact. Site without signs and symptoms of complications. Dressing and pressure applied. Pt denies pain at the site currently. No complaints noted.   Patient escorted via bed and discharged to designated CIR via bed. Report called to accepting facility; all questions and concerns addressed.   Glennette Galster S Chanae Gemma, RN _______________________________________________________________________

## 2020-04-21 NOTE — Progress Notes (Signed)
Pt noted tearful and stating that she missed her children and would like to go home. Husband remains bedside. Pt given caregiver support and asked if anything can be done to help her through her emotions. Pt did not express. Will continue to monitor.

## 2020-04-21 NOTE — Progress Notes (Signed)
Inpatient Rehabilitation Admissions Coordinator  I met with patient at bedside to discuss goals and expectations of a possible Cir admit. She asked appropriate questions and then she called her spouse and Mom to discuss with them She is in agreement to admit. I have alerted attending team, acute team and TOC. I will make the arrangements to admit today.  Danne Baxter, RN, MSN Rehab Admissions Coordinator [phone] 04/21/2020 12:27 PM

## 2020-04-21 NOTE — PMR Pre-admission (Signed)
PMR Admission Coordinator Pre-Admission Assessment  Patient: Gabrielle Singh is an 34 y.o., female MRN: [id number] DOB: 03-Apr-1986 Height: _0  (162.6 cm) Weight: 49.6 kg  Insurance Information  PRIMARY: uninsured  Development worker, community:       Phone#:  Patient states her Mom has applied for disability on line for her this week. [employer]/med assist has met with patient and spouse and gave medicaid application on 55/97 due to minor children in the home  The "Data Collection Information Summary" for patients in Inpatient Rehabilitation Facilities with attached "Privacy Act [place] Records" was provided and verbally reviewed with: N/A  Emergency Contact Information Contact Information    Name Relation Home Work [place] [phone]     Durward Fortes Mother   [phone]      Current Medical History  Patient Admitting Diagnosis: Debility  History of Present Illness: 35 year old right-handed female with history of bulimia as a teenager, rheumatoid arthritis that reportedly was diagnosed during a tele health visit, chronic anemia on no prescription medications and has not received any medical care in the past 8 years.  Presented 04/15/2020 with left-sided abdominal pain as well as intermittent left arm and leg weakness.  Patient reported this is progressed over the last 4 months.  Admission chemistries with sodium 134 potassium 3.4 BUN 6 creatinine 0.62, hemoglobin 8.2, lipase 34, TSH within normal limits.  Cranial CT/MRI scan negative for acute changes.  MRI cervical spine unremarkable.  CT angiogram of the chest showed no evidence of pulmonary emboli.  CTA of the abdomen pelvis showed grooved pancreatitis with secondary duodenitis, although felt to be less likely but not excluded.  Thickened proximal duodenum with suspected non perforated ulceration along the posterior lateral wall of the duodenal bulb with prominent fluid within the pancreaticoduodenal  groove.  Gastroenterology follow-up Dr. Loletha Carrow with recommendations of upper endoscopy of which patient declined.  She was placed on PPI 40 mg twice daily.  Tolerating a regular diet.  There was some suggestion symptoms were from underlying undiagnosed psychiatric illness contributing to her current presentation.  Iron deficiency anemia she did receive IV iron x2 and 1 unit packed red blood cells with latest hemoglobin 9.0.  In regards to patient's left arm and leg weakness CT and MRI were negative spoke with neurology services Dr. Leonel Ramsay nothing further to add on inpatient setting consider EMG outpatient.  Wound care consulted for stage I nonblanchable coccyx wound over bony prominence dressing changes as directed.  Tolerating regular diet.  Complete NIHSS TOTAL: 12  Patient's medical record from [facility] has been reviewed by the rehabilitation admission coordinator and physician.  Past Medical History  History reviewed. No pertinent past medical history.  Family History   family history is not on file.  Prior Rehab/Hospitalizations Has the patient had prior rehab or hospitalizations prior to admission? Yes  Has the patient had major surgery during 100 days prior to admission? No   Current Medications  Current Facility-Administered Medications:  .  (feeding supplement) PROSource Plus liquid 30 mL, 30 mL, Oral, BID BM, Oda Kilts, MD, 30 mL at 04/20/20 1609 .  feeding supplement (ENSURE ENLIVE / ENSURE PLUS) liquid 237 mL, 237 mL, Oral, TID BM, Oda Kilts, MD, 237 mL at 04/21/20 1212 .  folic acid (FOLVITE) tablet 1 mg, 1 mg, Oral, Daily, Madalyn Rob, MD, 1 mg at 04/20/20 0846 .  multivitamin with minerals tablet 1 tablet, 1 tablet, Oral, Daily, Madalyn Rob, MD, 1 tablet at  04/20/20 0826 .  pantoprazole (PROTONIX) EC tablet 40 mg, 40 mg, Oral, BID, Vena Rua, PA-C, 40 mg at 04/20/20 0973 .  polyethylene glycol (MIRALAX / GLYCOLAX) packet 17 g, 17 g,  Oral, QHS, Kennedy-Smith, Colleen M, NP, 17 g at 04/18/20 2147 .  potassium chloride SA (KLOR-CON) CR tablet 40 mEq, 40 mEq, Oral, BID, Virl Axe, MD .  thiamine tablet 100 mg, 100 mg, Oral, Daily, 100 mg at 04/20/20 5329 **OR** thiamine (B-1) injection 100 mg, 100 mg, Intravenous, Daily, Madalyn Rob, MD, 100 mg at 04/16/20 9242  Patients Current Diet:  Diet Order            Diet regular Room service appropriate? Yes; Fluid consistency: Thin  Diet effective now                 Precautions / Restrictions Precautions Precautions: Fall, Other (comment) Precaution Comments: extreme anxiety Restrictions Weight Bearing Restrictions: No   Has the patient had 2 or more falls or a fall with injury in the past year? No  Prior Activity Level Limited Community (1-2x/wk): decrease in function over past 6 months; was using RW until 1 month ago; spouse lifting  Prior Functional Level Self Care: Did the patient need help bathing, dressing, using the toilet or eating? Needed some help  Indoor Mobility: Did the patient need assistance with walking from room to room (with or without device)? Needed some help  Stairs: Did the patient need assistance with internal or external stairs (with or without device)? Needed some help  Functional Cognition: Did the patient need help planning regular tasks such as shopping or remembering to take medications? Needed some help  Home Assistive Devices / Three Way Devices/Equipment: Gilford Rile (specify type) Home Equipment: Walker - 2 wheels, Shower seat, Bedside commode  Prior Device Use: Indicate devices/aids used by the patient prior to current illness, exacerbation or injury? Walker  Current Functional Level Cognition  Overall Cognitive Status: No family/caregiver present to determine baseline cognitive functioning Orientation Level: Oriented X4 Following Commands: Follows multi-step commands inconsistently, Follows one step commands  with increased time Safety/Judgement: Decreased awareness of safety, Decreased awareness of deficits General Comments: pt with poor awareness of deficits, increased time required for processing, initation and poor problem solving     Extremity Assessment (includes Sensation/Coordination)  Upper Extremity Assessment: LUE deficits/detail LUE Deficits / Details: demonstrates 3-/5 shoulder elevation, retraction, elbow extension; 1/5 shoulder flexion, elbow flexion; 2-/5 finger flexion/extension; 0/5 wrist  LUE Sensation: decreased light touch, decreased proprioception LUE Coordination: decreased fine motor, decreased gross motor  Lower Extremity Assessment: Defer to PT evaluation LLE Deficits / Details: pt states she can't move her LLE, during evaluation pt able to assist with adduction to get to EOB as well as knee extension noted when therapist donning socks; pt states her leg will sometimes work LLE Sensation: WNL    ADLs  Overall ADL's : Needs assistance/impaired Grooming: Bed level, Wash/dry hands, Wash/dry face, Minimal assistance Grooming Details (indicate cue type and reason): assist to wash L hand  Upper Body Bathing: Moderate assistance, Bed level Upper Body Bathing Details (indicate cue type and reason): bed level bathing of UEs, with hand over hand on L side to wash R UE  Lower Body Bathing: Total assistance, Sitting/lateral leans, Bed level, +2 for physical assistance, +2 for safety/equipment Upper Body Dressing : Maximal assistance, Sitting Upper Body Dressing Details (indicate cue type and reason): +2 required EOB  Lower Body Dressing: Total assistance, +2 for physical  assistance, +2 for safety/equipment, Sitting/lateral leans, Bed level Toilet Transfer Details (indicate cue type and reason): deferred  Functional mobility during ADLs: Total assistance, +2 for physical assistance, +2 for safety/equipment General ADL Comments: pt limited by L sided weakness, impaired balance,  decreased activity tolerance and anxiety     Mobility  Overal bed mobility: Needs Assistance Bed Mobility: Rolling, Supine to Sit, Sit to Supine Rolling: Mod assist, Total assist Supine to sit: Total assist, +2 for physical assistance Sit to supine: Total assist, +2 for physical assistance General bed mobility comments: pt refused despite mulitple attempts- requires +2 to reposition to [facility] and into chair position     Transfers  Overall transfer level: Needs assistance Transfers: Squat Pivot Transfers Squat pivot transfers: Mod assist General transfer comment: refused despite multiple attempts    Ambulation / Gait / Stairs / Wheelchair Mobility  Ambulation/Gait General Gait Details: unable    Posture / Balance Balance Overall balance assessment: Needs assistance Sitting-balance support: Feet supported Sitting balance-Leahy Scale: Poor Postural control: Posterior lean Standing balance-Leahy Scale: Zero    Special needs/care consideration Coccyx stage 1 noted 04/15/2020 Dietary follow up needed for nutritional support Likely need follow up with Neuro psych for ongoing counseling Needs PCP at discharge   Previous Home Environment  Living Arrangements:  (children 10 and 8)  Lives With: Spouse, Daughter (minor children) Available Help at Discharge: Family, Available 24 hours/day Type of Home: House Home Layout: Multi-level, Bed/bath upstairs Alternate Level Stairs-Rails: Right Alternate Level Stairs-Number of Steps: 12 Home Access: Level entry Bathroom Shower/Tub: Chiropodist: Standard Bathroom Accessibility: Yes Home Care Services: No Additional Comments: pt reports spending most time upstairs, kids 8 and 10  Discharge Living Setting Plans for Discharge Living Setting: Patient's home, Lives with (comment) (spouse and 2 minor children) Type of Home at Discharge: House Discharge Home Layout: Two level, 1/2 bath on main level, Bed/bath upstairs Alternate  Level Stairs-Rails: Right Alternate Level Stairs-Number of Steps: 12 Discharge Home Access: Level entry Discharge Bathroom Shower/Tub: Tub/shower unit Discharge Bathroom Toilet: Standard Discharge Bathroom Accessibility: Yes How Accessible: Accessible via walker Does the patient have any problems obtaining your medications?: Yes (Describe) (uninsured; no PCP)  Social/Family/Support Systems Patient Roles: Spouse, Parent Contact Information: spouse Barrister's clerk Anticipated Caregiver: spouse and Mom Anticipated Ambulance person Information: see above Ability/Limitations of Caregiver: spouse disabled Economist Availability: 24/7 Discharge Plan Discussed with Primary Caregiver: Yes Is Caregiver In Agreement with Plan?: Yes Does Caregiver/Family have Issues with Lodging/Transportation while Pt is in Rehab?: No  Goals Patient/Family Goal for Rehab: supervision to min asisst PT and OT Expected length of stay: ELOS 1 week trial for participation and progression would consider additional time Pt/Family Agrees to Admission and willing to participate: Yes Program Orientation Provided & Reviewed with Pt/Caregiver Including Roles  & Responsibilities: Yes  Decrease burden of Care through IP rehab admission: n/a  Possible need for SNF placement upon discharge: not anticipated  Patient Condition: I have reviewed medical records [facility], spoken with  patient. I met with patient at the bedside for inpatient rehabilitation assessment.  Patient will benefit from ongoing PT and OT, can actively participate in 3 hours of therapy a day 5 days of the week, and can make measurable gains during the admission.  Patient will also benefit from the coordinated team approach during an Inpatient Acute Rehabilitation admission.  The patient will receive intensive therapy as well as Rehabilitation physician, nursing, social worker, and care management interventions.  Due to  bladder management, bowel  management, safety, skin/wound care, disease management, medication administration, pain management and patient education the patient requires 24 hour a day rehabilitation nursing.  The patient is currently mod to max assist overall with mobility and basic ADLs.  Discharge setting and therapy post discharge at home with home health is anticipated.  Patient has agreed to participate in the Acute Inpatient Rehabilitation Program and will admit today.  Preadmission Screen Completed By:  Cleatrice Burke, 04/21/2020 12:28 PM ______________________________________________________________________   Discussed status with Dr. Naaman Plummer  on  04/21/2020 at  25 and received approval for admission today.  Admission Coordinator:  Cleatrice Burke, RN, time  9678 Date  04/21/2020   Assessment/Plan: Diagnosis: debility after multiple medical, failure to thrive 1. Does the need for close, 24 hr/day Medical supervision in concert with the patient's rehab needs make it unreasonable for this patient to be served in a less intensive setting? Yes 2. Co-Morbidities requiring supervision/potential complications: RA, anorexia/bulimia, anemia,  3. Due to bladder management, bowel management, safety, skin/wound care, disease management, medication administration, pain management and patient education, does the patient require 24 hr/day rehab nursing? Yes 4. Does the patient require coordinated care of a physician, rehab nurse, PT, OT, and SLP to address physical and functional deficits in the context of the above medical diagnosis(es)? Yes Addressing deficits in the following areas: balance, endurance, locomotion, strength, transferring, bowel/bladder control, bathing, dressing, feeding, grooming, toileting and psychosocial support 5. Can the patient actively participate in an intensive therapy program of at least 3 hrs of therapy 5 days a week? Yes 6. The potential for patient to make measurable gains while on  inpatient rehab is excellent 7. Anticipated functional outcomes upon discharge from inpatient rehab: supervision and min assist PT, supervision and min assist OT, n/a SLP 8. Estimated rehab length of stay to reach the above functional goals is: 1 week trial and more time if tolerating/showing progression 9. Anticipated discharge destination: Home 10. Overall Rehab/Functional Prognosis: good   MD Signature: Meredith Staggers, MD, [facility] 04/21/2020

## 2020-04-21 NOTE — Progress Notes (Addendum)
Pt admitted to unit from 45M via bed with husband and RN x2. She is alert and oriented to person and place and time and situation. Pt does appear minimally withdrawn. Skin is warm and dry. Pressure ulcer noted to sacrum. See LDAs. Left foot drop noted. Left side weakness noted. Purewick in place for now. Pt oriented to staff and room

## 2020-04-21 NOTE — H&P (Signed)
Physical Medicine and Rehabilitation Admission H&P        Chief Complaint  Patient presents with  . Weakness  . Abdominal Pain  : HPI: Gabrielle Singh is a 34 year old right-handed female with history of bulimia as a teenager, rheumatoid arthritis that reportedly was diagnosed during a telehealth visit, chronic anemia on no prescription medications and has not received any medical care in the past 8 years.  Presented 04/15/2020 with left-sided abdominal pain as well as intermittent left arm and leg weakness.  Patient reported this is progressed over the last 4 months.  Admission chemistries with sodium 134 potassium 3.4 BUN 6 creatinine 0.62, hemoglobin 8.2, lipase 34, TSH within normal limits.  Cranial CT/MRI scan negative for acute changes.  MRI cervical spine unremarkable.  CT angiogram of the chest showed no evidence of pulmonary emboli.  CTA of the abdomen pelvis showed grooved pancreatitis with secondary duodenitis, although felt to be less likely but not excluded.  Thickened proximal duodenum with suspected nonperforated ulceration along the posterior lateral wall of the duodenal bulb with prominent fluid within the pancreaticoduodenal groove.  Gastroenterology follow-up Dr. Loletha Carrow with recommendations of upper endoscopy of which patient declined.  She was placed on PPI 40 mg twice daily.  Tolerating a regular diet.  There was some suggestion symptoms were from underlying undiagnosed psychiatric illness contributing to her current presentation.  Iron deficiency anemia she did receive IV iron x2 and 1 unit packed red blood cells with latest hemoglobin 9.0.  In regards to patient's left arm and leg weakness CT and MRI were negative spoke with neurology services Dr. Leonel Ramsay nothing further to add on inpatient setting consider EMG outpatient.  Wound care consulted for stage I nonblanchable coccyx wound over bony prominence dressing changes as directed.  Tolerating regular diet.  Therapy  evaluations completed with recommendations of physical medicine rehab consult.   Review of Systems  Constitutional: Positive for weight loss.       Decrease in appetite over the last 4 months  HENT: Negative for hearing loss.   Eyes: Negative for blurred vision and double vision.  Respiratory: Negative for cough and shortness of breath.   Cardiovascular: Negative for chest pain, palpitations and leg swelling.  Gastrointestinal: Positive for abdominal pain, nausea and vomiting.  Genitourinary: Negative for dysuria and hematuria.       Irregular menstrual bleeding  Musculoskeletal: Positive for myalgias.  Skin: Negative for rash.  Neurological:       Left arm weakness  Psychiatric/Behavioral: Positive for depression. The patient has insomnia.   All other systems reviewed and are negative.   History reviewed. No pertinent past medical history. History reviewed. No pertinent surgical history. No family history on file. Social History:  reports that she has never smoked. She has never used smokeless tobacco. She reports previous alcohol use. She reports previous drug use. Allergies: No Known Allergies No medications prior to admission.      Drug Regimen Review Drug regimen was reviewed and remains appropriate with no significant issues identified   Home: Home Living Family/patient expects to be discharged to:: Private residence Living Arrangements: Spouse/significant other, Children Available Help at Discharge: Family, Available 24 hours/day Type of Home: House Home Access: Level entry Home Layout: Multi-level, Bed/bath upstairs Alternate Level Stairs-Number of Steps: 12 Alternate Level Stairs-Rails: Right Bathroom Shower/Tub: Chiropodist: Standard Bathroom Accessibility: Yes Home Equipment: Walker - 2 wheels, Shower seat, Bedside commode Additional Comments: pt reports spending most time upstairs, kids  8 and 10   Functional History: Prior Function Level  of Independence: Needs assistance Gait / Transfers Assistance Needed: spouse assists walking with RW, carrying as needed for the last 3 months   ADL's / Homemaking Assistance Needed: reports spouse assists as needed, but has been managing using 1 handed techniques with ADLs    Functional Status:  Mobility: Bed Mobility Overal bed mobility: Needs Assistance Bed Mobility: Rolling, Supine to Sit, Sit to Supine Rolling: Mod assist, Total assist Supine to sit: Total assist, +2 for physical assistance Sit to supine: Total assist, +2 for physical assistance General bed mobility comments: refused despite multiple attempts Transfers Overall transfer level: Needs assistance Transfers: Squat Pivot Transfers Squat pivot transfers: Mod assist General transfer comment: refused despite multiple attempts Ambulation/Gait General Gait Details: unable   ADL: ADL Overall ADL's : Needs assistance/impaired Grooming: Minimal assistance, Bed level Upper Body Bathing: Moderate assistance, Bed level Lower Body Bathing: Total assistance, Sitting/lateral leans, Bed level, +2 for physical assistance, +2 for safety/equipment Upper Body Dressing : Maximal assistance, Sitting Upper Body Dressing Details (indicate cue type and reason): +2 required EOB  Lower Body Dressing: Total assistance, +2 for physical assistance, +2 for safety/equipment, Sitting/lateral leans, Bed level Toilet Transfer Details (indicate cue type and reason): deferred Functional mobility during ADLs: Total assistance, +2 for physical assistance, +2 for safety/equipment General ADL Comments: pt limited by L sided weakness, impaired balance, decreased activity tolerance and anxiety    Cognition: Cognition Overall Cognitive Status: No family/caregiver present to determine baseline cognitive functioning Orientation Level: Oriented X4 Cognition Arousal/Alertness: Awake/alert Behavior During Therapy: Anxious Overall Cognitive Status: No  family/caregiver present to determine baseline cognitive functioning Area of Impairment: Safety/judgement, Problem solving, Following commands Following Commands: Follows multi-step commands inconsistently, Follows one step commands with increased time Safety/Judgement: Decreased awareness of safety, Decreased awareness of deficits Problem Solving: Slow processing, Difficulty sequencing, Requires verbal cues, Requires tactile cues, Decreased initiation General Comments: resistant to any attempts at mobility; at multiple points in session, closed her eyes and tried to pretend we were not there; very not motivated and needed max coaxing to participate. Kept saying she did not feel good, unable to really give a good answer when I challenged her on what her plan for mobility at home was   Physical Exam: Blood pressure 104/69, pulse 79, temperature 99.1 F (37.3 C), temperature source Oral, resp. rate 16, height 5\' 4"  (1.626 m), weight 49.6 kg, SpO2 95 %. Physical Exam Constitutional:      Appearance: She is ill-appearing.  HENT:     Head: Normocephalic and atraumatic.     Mouth/Throat:     Mouth: Mucous membranes are moist.  Eyes:     Extraocular Movements: Extraocular movements intact.     Pupils: Pupils are equal, round, and reactive to light.  Cardiovascular:     Rate and Rhythm: Normal rate and regular rhythm.     Heart sounds: No murmur heard.  No friction rub. No gallop.   Pulmonary:     Effort: Pulmonary effort is normal. No respiratory distress.     Breath sounds: Normal breath sounds.  Abdominal:     General: Abdomen is flat. Bowel sounds are normal. There is no distension or abdominal bruit.     Palpations: Abdomen is soft.  Musculoskeletal:        General: No swelling.     Comments: Bilateral R>L heel cord tightness, LE muscle wasting  Skin:    General: Skin is warm.  Comments: Small stage 1 on coccyx/sacrum. Foam dressing in place  Neurological:     Mental Status: She  is alert and oriented to person, place, and time.     Comments: Patient is alert in no acute distress.  Mood is a bit flat but appropriate.  Follows commands and oriented x3. RUE 4/5. LUE 1 to 2/5 with some inconsistencies. LE: 1-2/5 prox to distal, Right weaker then left. No focal sensory deficits. DTR's 1+. Hoover's test +/-  Psychiatric:     Comments: Pt flat but cooperative and overall pleasant        Lab Results Last 48 Hours        Results for orders placed or performed during the hospital encounter of 04/15/20 (from the past 48 hour(s))  Glucose, capillary     Status: Abnormal    Collection Time: 04/19/20 11:24 AM  Result Value Ref Range    Glucose-Capillary 102 (H) 70 - 99 mg/dL      Comment: Glucose reference range applies only to samples taken after fasting for at least 8 hours.  Glucose, capillary     Status: None    Collection Time: 04/19/20  4:35 PM  Result Value Ref Range    Glucose-Capillary 84 70 - 99 mg/dL      Comment: Glucose reference range applies only to samples taken after fasting for at least 8 hours.  Magnesium     Status: Abnormal    Collection Time: 04/19/20  5:30 PM  Result Value Ref Range    Magnesium 1.6 (L) 1.7 - 2.4 mg/dL      Comment: Performed at [facility] [address], [place], [place] [address]  Phosphorus     Status: None    Collection Time: 04/19/20  5:30 PM  Result Value Ref Range    Phosphorus 4.3 2.5 - 4.6 mg/dL      Comment: Performed at [facility] [address], [place], [place] [address]  Glucose, capillary     Status: Abnormal    Collection Time: 04/19/20  9:17 PM  Result Value Ref Range    Glucose-Capillary 115 (H) 70 - 99 mg/dL      Comment: Glucose reference range applies only to samples taken after fasting for at least 8 hours.  CBC     Status: Abnormal    Collection Time: 04/20/20  1:04 AM  Result Value Ref Range    WBC 4.8 4.0 - 10.5 K/uL    RBC 3.12 (L) 3.87 - 5.11 MIL/uL    Hemoglobin 8.6 (L) 12.0 -  15.0 g/dL    HCT 28.1 (L) 36 - 46 %    MCV 90.1 80.0 - 100.0 fL    MCH 27.6 26.0 - 34.0 pg    MCHC 30.6 30.0 - 36.0 g/dL    RDW 20.5 (H) 11.5 - 15.5 %    Platelets 262 150 - 400 K/uL    nRBC 0.0 0.0 - 0.2 %      Comment: Performed at [facility] [address] [place], [place] [address]  Basic metabolic panel     Status: Abnormal    Collection Time: 04/20/20  1:04 AM  Result Value Ref Range    Sodium 140 135 - 145 mmol/L    Potassium 3.2 (L) 3.5 - 5.1 mmol/L    Chloride 107 98 - 111 mmol/L    CO2 26 22 - 32 mmol/L    Glucose, Bld 93 70 - 99 mg/dL  Comment: Glucose reference range applies only to samples taken after fasting for at least 8 hours.    BUN 5 (L) 6 - 20 mg/dL    Creatinine, Ser 0.59 0.44 - 1.00 mg/dL    Calcium 8.2 (L) 8.9 - 10.3 mg/dL    GFR, Estimated >60 >60 mL/min      Comment: (NOTE) Calculated using the CKD-EPI Creatinine Equation (2021)      Anion gap 7 5 - 15      Comment: Performed at [facility] [address] [place], [place] [address]  Magnesium     Status: Abnormal    Collection Time: 04/20/20  1:04 AM  Result Value Ref Range    Magnesium 1.5 (L) 1.7 - 2.4 mg/dL      Comment: Performed at [facility] [address] [place], [place] [address]  Phosphorus     Status: None    Collection Time: 04/20/20  1:04 AM  Result Value Ref Range    Phosphorus 4.2 2.5 - 4.6 mg/dL      Comment: Performed at [facility] [address] [place], [place] [address]  Hepatic function panel     Status: Abnormal    Collection Time: 04/20/20  1:04 AM  Result Value Ref Range    Total Protein 5.2 (L) 6.5 - 8.1 g/dL    Albumin 1.4 (L) 3.5 - 5.0 g/dL    AST 40 15 - 41 U/L    ALT 16 0 - 44 U/L    Alkaline Phosphatase 72 38 - 126 U/L    Total Bilirubin 0.8 0.3 - 1.2 mg/dL    Bilirubin, Direct 0.1 0.0 - 0.2 mg/dL    Indirect Bilirubin 0.7 0.3 - 0.9 mg/dL      Comment: Performed at [facility] [address], [place], [place]  [address]  Glucose, capillary     Status: None    Collection Time: 04/20/20  7:00 AM  Result Value Ref Range    Glucose-Capillary 70 70 - 99 mg/dL      Comment: Glucose reference range applies only to samples taken after fasting for at least 8 hours.  Glucose, capillary     Status: None    Collection Time: 04/20/20 11:21 AM  Result Value Ref Range    Glucose-Capillary 82 70 - 99 mg/dL      Comment: Glucose reference range applies only to samples taken after fasting for at least 8 hours.  Glucose, capillary     Status: Abnormal    Collection Time: 04/20/20  4:16 PM  Result Value Ref Range    Glucose-Capillary 137 (H) 70 - 99 mg/dL      Comment: Glucose reference range applies only to samples taken after fasting for at least 8 hours.  Glucose, capillary     Status: Abnormal    Collection Time: 04/20/20  9:00 PM  Result Value Ref Range    Glucose-Capillary 107 (H) 70 - 99 mg/dL      Comment: Glucose reference range applies only to samples taken after fasting for at least 8 hours.  CBC     Status: Abnormal    Collection Time: 04/21/20  5:55 AM  Result Value Ref Range    WBC 4.7 4.0 - 10.5 K/uL    RBC 3.27 (L) 3.87 - 5.11 MIL/uL    Hemoglobin 9.0 (L) 12.0 - 15.0 g/dL    HCT 30.1 (L) 36 - 46 %    MCV  92.0 80.0 - 100.0 fL    MCH 27.5 26.0 - 34.0 pg    MCHC 29.9 (L) 30.0 - 36.0 g/dL    RDW 21.0 (H) 11.5 - 15.5 %    Platelets 281 150 - 400 K/uL    nRBC 0.0 0.0 - 0.2 %      Comment: Performed at [facility] [address] [place], [place] [address]  Basic metabolic panel     Status: Abnormal    Collection Time: 04/21/20  5:55 AM  Result Value Ref Range    Sodium 138 135 - 145 mmol/L    Potassium 3.4 (L) 3.5 - 5.1 mmol/L    Chloride 104 98 - 111 mmol/L    CO2 29 22 - 32 mmol/L    Glucose, Bld 81 70 - 99 mg/dL      Comment: Glucose reference range applies only to samples taken after fasting for at least 8 hours.    BUN 6 6 - 20 mg/dL    Creatinine, Ser 0.52 0.44 - 1.00 mg/dL     Calcium 8.5 (L) 8.9 - 10.3 mg/dL    GFR, Estimated >60 >60 mL/min      Comment: (NOTE) Calculated using the CKD-EPI Creatinine Equation (2021)      Anion gap 5 5 - 15      Comment: Performed at [facility] [address] [place], [place] [address]  Cortisol-am, blood     Status: None    Collection Time: 04/21/20  5:55 AM  Result Value Ref Range    Cortisol - AM 12.1 6.7 - 22.6 ug/dL      Comment: Performed at [facility], [place] [address], [place], [place] [address]  Glucose, capillary     Status: Abnormal    Collection Time: 04/21/20  6:43 AM  Result Value Ref Range    Glucose-Capillary 59 (L) 70 - 99 mg/dL      Comment: Glucose reference range applies only to samples taken after fasting for at least 8 hours.  Glucose, capillary     Status: None    Collection Time: 04/21/20  7:22 AM  Result Value Ref Range    Glucose-Capillary 84 70 - 99 mg/dL      Comment: Glucose reference range applies only to samples taken after fasting for at least 8 hours.  Magnesium     Status: None    Collection Time: 04/21/20  9:15 AM  Result Value Ref Range    Magnesium 1.8 1.7 - 2.4 mg/dL      Comment: Performed at [facility] [address], [place], [place] [address]      Imaging Results (Last 48 hours)  No results found.           Medical Problem List and Plan: 1.  Debility secondary to iron deficiency anemia and failure to thrive as well as left-sided abdominal pain. Chronic LUE of unknown origin. Neurological exam not entirely consistent.              -patient may  shower             -ELOS/Goals: supervision to min assist, 1 week trial to start 2.  Antithrombotics: -DVT/anticoagulation: SCDs             -antiplatelet therapy: N/A 3. Pain Management: Tylenol as needed 4. Mood: Provide emotional support.                -suspect overlying psychiatric  issues impacting functional deficits. Has history of bulimia. Ask Neuropsych to see patient while here              -antipsychotic agents: N/A 5. Neuropsych: This patient is capable of making decisions on her own behalf. 6. Skin/Wound Care: foam dressing, pressure relief, improve nutrition to stage I on coccyx. 7. Fluids/Electrolytes/Nutrition: Routine in and outs with follow-up chemistries 8.  Left-sided abdominal pain.  Continue PPI.  Patient declined EGD.  Follow-up gastroenterology services as needed 9.  Iron deficiency anemia.  Received IV iron x2 and 1 unit packed red blood cells.  Follow-up CBC 10.  Left arm weakness as well as left leg.  Cranial CT scan MRI negative.  Spoke with neurology services nothing more to add on the inpatient setting.  Consider EMG as outpatient 11.  Decreased nutritional storage.  Dietary follow-up             -team/family encouraging pt to increase intake             -she had an assortment of snacks on tray table in room         Cathlyn Parsons, PA-C 04/21/2020  I have personally performed a face to face diagnostic evaluation of this patient and formulated the key components of the plan.  Additionally, I have personally reviewed laboratory data, imaging studies, as well as relevant notes and concur with the physician assistant's documentation above.  The patient's status has not changed from the original H&P.  Any changes in documentation from the acute care chart have been noted above.  Meredith Staggers, MD, [facility]

## 2020-04-21 NOTE — Discharge Summary (Addendum)
Name: Gabrielle Singh MRN: [id number] DOB: [birth year] 34 y.o. PCP: Patient, No Pcp Per  Date of Admission: 04/15/2020  1:14 PM Date of Discharge: 04/21/2020 02:19 PM Attending Physician: Oda Kilts, MD  Discharge Diagnosis: 1. Iron deficiency anemia 2/2 blood loss anemia 2. Severe ulceration vs pancreatitis vs malignancy in distal stomach or proximal duodenum 3. Hypoglycemia 4. Left arm flaccid paralysis   Discharge Medications: Allergies as of 04/21/2020   No Known Allergies      Medication List     TAKE these medications    feeding supplement Liqd Take 237 mLs by mouth 3 (three) times daily between meals.   (feeding supplement) PROSource Plus liquid Take 30 mLs by mouth 2 (two) times daily between meals.   folic acid 1 MG tablet Commonly known as: FOLVITE Take 1 tablet (1 mg total) by mouth daily. Start taking on: April 22, 2020   multivitamin with minerals Tabs tablet Take 1 tablet by mouth daily. Start taking on: April 22, 2020   pantoprazole 40 MG tablet Commonly known as: PROTONIX Take 1 tablet (40 mg total) by mouth 2 (two) times daily.   polyethylene glycol 17 g packet Commonly known as: MIRALAX / GLYCOLAX Take 17 g by mouth at bedtime.   thiamine 100 MG tablet Take 1 tablet (100 mg total) by mouth daily. Start taking on: April 22, 2020               Discharge Care Instructions  (From admission, onward)           Start     Ordered   04/21/20 0000  Discharge wound care:       Comments: Pressure Injury 04/15/20 Coccyx Medial Stage 1 -  Intact skin with non-blanchable redness of a localized area usually over a bony prominence. small round wound. Apply sacral foam dressing to coccyx. Peel down foam dressing EACH shift.  Record your observations.  Change foam dressing every 3 days or PRN soiling.   04/21/20 1454            Disposition and follow-up:   Ms.Gabrielle Singh was discharged from [facility]  in [place] condition.  At the hospital follow up visit please address:  1. Iron deficiency anemia 2/2 blood loss anemia. Severe ulceration vs pancreatitis vs malignancy in distal stomach or proximal duodenum. Ferritin 6, iron 15. Concern for GI bleeding, PUD vs malignancy. Could also be from chronic malnutrition. Received IV feraheme x2 and 1 unit pRBCs with appropriate response. Treating with protonix 43m BID. She declined EGD, please recheck CBC and consider working on agreement to proceed with EGD.   2. Hypoglycemia. No history of diabetes and not on any diabetic medications. Could be from depleted glycogen stores or impaired gluconeogenesis. CBGs tend to go <70 when she does not eat. Serum insulin and c-peptide WNL. AM cortisol WNL. Recommend seeing an endocrinologist as outpatient.  3. Chronic malnutrition with associated electrolyte abnormalities. Hx of bulimia in her teens. Likely has an eating disorder. Has not been eating well for past 2 years. Albumin 1.4. Have been encouraging increased food intake. She would benefit from working with a therapist and nutritionist. Will be discharging to CIR for further care.  4. Left arm flaccid paralysis. Also with variable bilateral leg weakness. Negative CT head, MRI brain, and MRI C-spine. May benefit from outpatient EMG and nerve conduction studies. Hopefully will benefit from CIR care, including PMR and neuropsych. Will need ongoing work with neuro and psych.  2.  Labs / imaging needed at time of follow-up: CBC, BMP  3.  Pending labs/ test needing follow-up: HbA1c  Follow-up Appointments:    Hospital Course by problem list: 1. Left-sided abdominal pain: CT abd/pel showing suspected non-perforated posterolateral wall duodenal bulb ulcer with prominent fluid within pancreaticoduodenal groove, concerning for severe ulceration vs pancreatitis vs malignancy in distal stomach or proximal duodenum. Pain resolved without intervention.   Patient appears to  have an underlying undiagnosed psychiatric illness that may be contributing to her current presentation. She does have a reported history of bulimia, possible underlying anxiety and eating disorder. GI was consulted and recommended an EGD, however the patient refused consent for the procedure. Per GI's recommendation she is being treated empirically with Protonix 40 mg BID. Patient is not complaining of abdominal pain. Overall the recommendation is follow up outpatient with her PCP. She should consider an upper EGD at a later date, or alternatively obtain a repeat CT scan in a month or so.  Iron deficiency anemia: Clear iron deficiency with ferritin 6, iron 15. Concern for GI bleeding, possibly PUD vs malignancy. Other possibility would be malnutrition or malabsorption. Patient received IV iron x2 and 1 unit pRBCs with appropriate response. Hemoglobin has since remained stable. Hgb at discharge is 9.0, LDH is 93, and haptoglobin is 94.   Hypoglycemia: Patient with no history of diabetes or on any diabetic medications. Possibly due to depleted glycogen stores with chronic malnutrition. Patient's CBGs have been inconsistent with PO intake and have required supplementation with IV dextrose for CBGs <70. We considered the possibility of insulinoma, and exogenous insulin use, and ordered lab values for insulin (<0.4), C-peptide (0.9), and sulfonylurea (still pending). We screened for adrenal insufficieny however, unlikely provided her hypokalemia and her AM cortisol level was 12.1 (WNL). It is our recommendation that the patient follow up with outpatient endocrinology for further assessment.   Moderate Malnutrition (Hypokalemia, hypomagnesemia): Husband reports that patient has not been eating well for past 2 years. She reports no intake for past week before admission. At high risk for refeeding syndrome. Her hepatic panel was significant for decreased albumin 1.4 and total protein 5.2, otherwise normal.  Urinalysis was significant for proteinuria and ketonuria. We supplemented her with IV thiamine, folate, MVA, Kcl and Mg. Her potassium and magnesium levels have have remained low despite repletion. Her electrolytes should continued to be monitored after discharge. Our recommendation is that the patient consider outpatient resources for eating disorders, including working with a therapist and nutritionist to improve her quality of life.    Left arm flaccid paralysis & weakness of both legs:  She reports she has been unable to move left arm or left leg for the past 6 months. CT head was negative for acute intracranial abnormality. MRI brain and c-spine were also negative. We consulted with Dr. Leonel Ramsay from Neurology, and he believes that there is nothing to do in the inpatient setting. He advised that patient may benefit from EMG and nerve conduction studies as an outpatient. Our PT/OT colleagues worked with her to improve strength and functionality. Their recommendation was that the patient needs 24 hours assistance and would benefit from inpatient rehabilitation.    Pressure Injury: She had a stage 1 nonblanchable coccyx wound over her bony prominence at admission. It was measured as 1 cm x 0.8 cm. Recommended dressing changed every 3 days, unless soiled.  Discharge Vitals:   BP 104/69 (BP Location: Right Arm)   Pulse 79   Temp 99.1  F (37.3 C) (Oral)   Resp 16   Ht 5' 4"  (1.626 m)   Wt 49.6 kg   SpO2 95%   BMI 18.77 kg/m   Pertinent Labs, Studies, and Procedures:  CBC Latest Ref Rng & Units 04/21/2020 04/20/2020 04/19/2020  WBC 4.0 - 10.5 K/uL 4.7 4.8 4.6  Hemoglobin 12.0 - 15.0 g/dL 9.0(L) 8.6(L) 8.4(L)  Hematocrit 36 - 46 % 30.1(L) 28.1(L) 27.2(L)  Platelets 150 - 400 K/uL 281 262 264   CMP Latest Ref Rng & Units 04/21/2020 04/20/2020 04/19/2020  Glucose 70 - 99 mg/dL 81 93 86  BUN 6 - 20 mg/dL 6 5(L) <5(L)  Creatinine 0.44 - 1.00 mg/dL 0.52 0.59 0.57  Sodium 135 - 145 mmol/L 138  140 139  Potassium 3.5 - 5.1 mmol/L 3.4(L) 3.2(L) 4.1  Chloride 98 - 111 mmol/L 104 107 104  CO2 22 - 32 mmol/L 29 26 27   Calcium 8.9 - 10.3 mg/dL 8.5(L) 8.2(L) 8.3(L)  Total Protein 6.5 - 8.1 g/dL - 5.2(L) -  Total Bilirubin 0.3 - 1.2 mg/dL - 0.8 -  Alkaline Phos 38 - 126 U/L - 72 -  AST 15 - 41 U/L - 40 -  ALT 0 - 44 U/L - 16 -   Hepatic Function Latest Ref Rng & Units 04/20/2020 04/17/2020 04/16/2020  Total Protein 6.5 - 8.1 g/dL 5.2(L) 5.6(L) 6.2(L)  Albumin 3.5 - 5.0 g/dL 1.4(L) 1.5(L) 1.7(L)  AST 15 - 41 U/L 40 20 24  ALT 0 - 44 U/L 16 12 15   Alk Phosphatase 38 - 126 U/L 72 60 70  Total Bilirubin 0.3 - 1.2 mg/dL 0.8 2.0(H) 0.8  Bilirubin, Direct 0.0 - 0.2 mg/dL 0.1 0.1 -     Urinalysis    Component Value Date/Time   COLORURINE ORANGE (A) 04/17/2020 1743   APPEARANCEUR CLOUDY (A) 04/17/2020 1743   LABSPEC 1.025 04/17/2020 1743   PHURINE 5.5 04/17/2020 1743   GLUCOSEU NEGATIVE 04/17/2020 1743   HGBUR LARGE (A) 04/17/2020 1743   BILIRUBINUR MODERATE (A) 04/17/2020 1743   KETONESUR >80 (A) 04/17/2020 1743   PROTEINUR 30 (A) 04/17/2020 1743   NITRITE NEGATIVE 04/17/2020 1743   LEUKOCYTESUR MODERATE (A) 04/17/2020 1743   Iron/TIBC/Ferritin/ %Sat    Component Value Date/Time   IRON 15 (L) 04/15/2020 2200   TIBC 367 04/15/2020 2200   FERRITIN 6 (L) 04/15/2020 2200   IRONPCTSAT 4 (L) 04/15/2020 2200    Discharge Instructions: Discharge Instructions     Amb Referral to Nutrition and Diabetic E   Complete by: As directed    Amb Referral to Nutrition and Diabetic E   Complete by: As directed    Concerned for eating disorder. Pt informed RD that she frequently goes days without eating for fear of becoming fat and feels that she must earn her calories through movement.   Call MD for:  difficulty breathing, headache or visual disturbances   Complete by: As directed    Call MD for:  extreme fatigue   Complete by: As directed    Call MD for:  hives   Complete by: As  directed    Call MD for:  persistant dizziness or light-headedness   Complete by: As directed    Call MD for:  persistant nausea and vomiting   Complete by: As directed    Call MD for:  redness, tenderness, or signs of infection (pain, swelling, redness, odor or green/yellow discharge around incision site)   Complete by: As directed  Call MD for:  severe uncontrolled pain   Complete by: As directed    Call MD for:  temperature >100.4   Complete by: As directed    Diet - low sodium heart healthy   Complete by: As directed    Discharge instructions   Complete by: As directed    Hi Ms Arras, it was a pleasure taking care of you during your time here. You came in with left sided belly pain and left sided weakness.  We are transferring you to the inpatient rehabilitation floor of the hospital so that you can get more extensive rehabilitation to help with your left sided weakness. Furthermore, they will be able to manage your glucose levels and electrolytes there as well.  You may benefit from seeing a Registered Dietitian in the outpatient setting.  Also, at some point in the future when you feel more comfortable, I do highly recommend getting an EGD done so that they can take a look in your stomach.   Discharge wound care:   Complete by: As directed    Pressure Injury 04/15/20 Coccyx Medial Stage 1 -  Intact skin with non-blanchable redness of a localized area usually over a bony prominence. small round wound. Apply sacral foam dressing to coccyx. Peel down foam dressing EACH shift.  Record your observations.  Change foam dressing every 3 days or PRN soiling.   Increase activity slowly   Complete by: As directed        Signed: Virl Axe, MD 04/21/2020, 2:54 PM   Pager: [phone]

## 2020-04-21 NOTE — Progress Notes (Signed)
Occupational Therapy Treatment Patient Details Name: Gabrielle Singh MRN: [id number] DOB: [birth year] Today's Date: 04/21/2020    History of present illness Pt is a 34 y/o female with no pertinent PMH presenting with L sided pain and weakness for the past 3 days. 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 CT and MRI negative.    OT comments  Patient supine in bed, reports nauseated with stomach pain this am.  Requires max encouragement to participate in bed level activities, educated on importance of engagement to maintain strength, reduce pain, reducing burden of care on spouse.  Patient completed grooming with min assist, UB bathing with mod assist.  Engaged in L UE exercises including: shoulder elevation, retraction, IR/ER and flexion, elbow flexion/extension x 5 reps each; requires cueing sustained hold for strengthening. Encouraged pt to complete shoulder strengthening exercises throughout the day. Will follow acutely, SNF remains appropriate.    Follow Up Recommendations  Supervision/Assistance - 24 hour;SNF    Equipment Recommendations  3 in 1 bedside commode    Recommendations for Other Services      Precautions / Restrictions Precautions Precautions: Fall;Other (comment) Precaution Comments: extreme anxiety Restrictions Weight Bearing Restrictions: No       Mobility Bed Mobility Overal bed mobility: Needs Assistance             General bed mobility comments: pt refused despite mulitple attempts- requires +2 to reposition to [facility] and into chair position   Transfers                 General transfer comment: refused despite multiple attempts    Balance Overall balance assessment: Needs assistance Sitting-balance support: Feet supported Sitting balance-Leahy Scale: Poor   Postural control: Posterior lean   Standing balance-Leahy Scale: Zero                             ADL either performed or  assessed with clinical judgement   ADL Overall ADL's : Needs assistance/impaired     Grooming: Bed level;Wash/dry hands;Wash/dry face;Minimal assistance Grooming Details (indicate cue type and reason): assist to wash L hand    Upper Body Bathing Details (indicate cue type and reason): bed level bathing of UEs, with hand over hand on L side to wash R UE                Toilet Transfer Details (indicate cue type and reason): deferred                  Vision       Perception     Praxis      Cognition Arousal/Alertness: Awake/alert Behavior During Therapy: Anxious Overall Cognitive Status: No family/caregiver present to determine baseline cognitive functioning Area of Impairment: Safety/judgement;Problem solving;Following commands;Awareness                       Following Commands: Follows multi-step commands inconsistently;Follows one step commands with increased time Safety/Judgement: Decreased awareness of safety;Decreased awareness of deficits Awareness: Emergent Problem Solving: Slow processing;Difficulty sequencing;Requires verbal cues;Requires tactile cues;Decreased initiation General Comments: pt with poor awareness of deficits, increased time required for processing, initation and poor problem solving         Exercises Exercises: General Upper Extremity General Exercises - Upper Extremity Shoulder Flexion: AAROM;5 reps;Left;Supine Shoulder Extension: AAROM;Left;5 reps;Supine Shoulder Internal Rotation: AROM Left 5 reps supine   Shoulder External rotation: AAROM Left 5 reps  supine  Elbow Flexion: AAROM;Left;5 reps;Supine Elbow Extension: AAROM;Left;10 reps;Supine   Shoulder Instructions       General Comments resistant to therapy today    Pertinent Vitals/ Pain       Pain Assessment: Faces Faces Pain Scale: Hurts little more Pain Location: stomach/nauseous Pain Descriptors / Indicators: Discomfort Pain Intervention(s): Limited activity  within patient's tolerance;Monitored during session;Repositioned;Other (comment) (asked RN For nausea meds )  Home Living   Living Arrangements:  (children 10 and 8)                                  Lives With: Spouse;Daughter (minor children)    Prior Functioning/Environment              Frequency  Min 3X/week        Progress Toward Goals  OT Goals(current goals can now be found in the care plan section)  Progress towards OT goals: Progressing toward goals (limited)  Acute Rehab OT Goals Patient Stated Goal: "I want to walk"  Plan Discharge plan remains appropriate;Frequency remains appropriate    Co-evaluation    PT/OT/SLP Co-Evaluation/Treatment: Yes Reason for Co-Treatment: For patient/therapist safety;To address functional/ADL transfers   OT goals addressed during session: ADL's and self-care;Strengthening/ROM      AM-PAC OT "6 Clicks" Daily Activity     Outcome Measure   Help from another person eating meals?: A Little Help from another person taking care of personal grooming?: A Little Help from another person toileting, which includes using toliet, bedpan, or urinal?: Total Help from another person bathing (including washing, rinsing, drying)?: A Little Help from another person to put on and taking off regular upper body clothing?: A Lot Help from another person to put on and taking off regular lower body clothing?: Total 6 Click Score: 13    End of Session    OT Visit Diagnosis: Other abnormalities of gait and mobility (R26.89);Muscle weakness (generalized) (M62.81);Pain;Other symptoms and signs involving the nervous system (R29.898);Other symptoms and signs involving cognitive function Pain - Right/Left: Left Pain - part of body: Ankle and joints of foot   Activity Tolerance Other (comment);Patient limited by fatigue (participation )   Patient Left in bed;with call bell/phone within reach;with bed alarm set   Nurse Communication  Mobility status        Time: 5009-3818 OT Time Calculation (min): 30 min  Charges: OT General Charges $OT Visit: 1 Visit OT Treatments $Self Care/Home Management : 8-22 mins  Jolaine Artist, OT [place] Pager [phone] [place] 04/21/2020, 12:15 PM

## 2020-04-21 NOTE — Progress Notes (Addendum)
Subjective:  Patient felt nauseated this morning and was unable to fully participate in PT activities. She said she was able to do "sit ups," but not get in/out of bed, or sit at the end of the bed edge. She said she did not feel confident with the therapist/RN to support her properly and prevent her from falling. She said her husband is a "bigger" man and felt more secure with his assistance. She did not eat breakfast this morning, and when asked why she responded "I don't know." She said all she had yesterday was a salad and a bag of chips. She did "chug" one ensure plus brought to her by the dietician.   We discussed the possibility of seeking additional resources specific to eating disorders that include speaking with a therapist, and working with a nutritionist. We discussed the benefits of setting nutritional goals for each day and if needed, to supplement with Ensure shakes.   We also discussed the recommendation of inpatient rehabilitation. She is open to the idea, her main concern is finances.   Lastly we discussed the need of consulting endocrinology to further assess her hypoglycemia. Our recommendation is to stay in the hospital and continue to be monitored given her low potassium levels despite replenishing them the day before.   Objective:  Vital signs in last 24 hours: Vitals:   04/20/20 1616 04/20/20 2101 04/21/20 0436 04/21/20 0901  BP: (!) 96/53 105/62 102/69 104/69  Pulse: 91 84 78 79  Resp: 18 18 16 16   Temp: 99.2 F (37.3 C) 98.9 F (37.2 C) 98.6 F (37 C) 99.1 F (37.3 C)  TempSrc: Oral Oral Oral Oral  SpO2: 100% 100% 100% 95%  Weight:      Height:       Weight change:   Intake/Output Summary (Last 24 hours) at 04/21/2020 1217 Last data filed at 04/21/2020 0901 Gross per 24 hour  Intake 780 ml  Output 400 ml  Net 380 ml   Physical Exam General: She is NAD. She is ill-appearing.  CV: RRR, nl S1/S2, no murmur/rubs/gallops Pulm: nl breathing efforts, no  rales/rhonchi/wheezing Abd: soft, non-distended, nl BS in all four quadrants Neuro: AAOx3, 4/5 strength in RUE and RLE. 0/5 strength in LUE and LLE. Sensation intact in bilateral lower extremities and RUE. Sensation absent distal to the wrist in LUE, but present proximal to wrist.  Psych: Remains anxious  Assessment/Plan:  Principal Problem:   Acute on chronic blood loss anemia Active Problems:   Abdominal pain   Pressure injury of skin   Left arm weakness   Iron deficiency anemia   Hypoglycemia   Abnormal CT scan, gastrointestinal tract   Malnutrition of moderate degree  34 yo woman with no PMH other than bulimia as a teenager and 2 normal pregnancies who presented with acute left-sided abdominal/flank pain, poor appetite/eating, and 6 months of progressive weakness in her left arm and both legs. No medical care in past 8 years. Hospital course has been significant for recurrent hypoglycemia, progressive anemia requiring transfusion, and anxiety about hospitalization and medical procedures. Patient has been approved for CIR and will be discharged today.   Iron deficiency anemia 2/2 blood loss anemia Clear iron deficiency with ferritin 6, iron 15. Concern for GI bleeding, possibly PUD vs malignancy. Other possibility would be malnutrition or malabsorption. Retic index is low, likely hypoproliferation due to iron deficiency. Received IV iron x2 and 1 unit pRBCs with appropriate response, Hgb today 9.0. LDH 93, haptoglobin 94 -  likely not hemolysis. Hemoglobin has remained stable. -Daily CBC -Given IV feraheme on 10/24 and 04/19/20   Left-sided abdominal pain CT abd/pel showing suspected non-perforated posterolateral wall duodenal bulb ulcer with prominent fluid within pancreaticoduodenal groove, concerning for severe ulceration vs pancreatitis vs malignancy in distal stomach or proximal duodenum. Patient appears to have an underlying undiagnosed psychiatric illness that may be contributing  to her current presentation. She does have a reported history of bulimia, possible underlying anxiety and eating disorder. Still not consenting to EGD, GI will plan to treat empirically with PPI. Patient is not complaining of abdominal pain. - Continue PPI 40 mg BID - daily CBC - daily BMP - Diet was switched from heart healthy to regular   Hypoglycemia Patient with no history of diabetes or on any diabetic medications. Possibly due to depleted glycogen stores with chronic malnutrition. Patient's CBGs have been 107> 59> 81> 77. Low CBG of 59 most likely to inadequate feeding. RN provided fruit juice, patient's next reading was 81.  - f/u sulfonylurea level (pending) - AM cortisol is 12.1 WNL, ruling out suspicion of adrenal insufficiency - monitor CBGs - Will reach out to outpatient endocrinology to assess etiology of repeating episodes of hypoglycemia and see if additional workup is needed - IV dextrose for CBG <70 - Discussed the importance of consistent nutritional intake   Moderate Malnutrition Hypokalemia, hypomagnesemia Husband reports that patient has not been eating well for past 2 years. She reports no intake for past week before admission. At high risk for refeeding syndrome. Patient received KCl and Mg yesterday and her morning BMP showed minimal increase in potassium (3.2 to 3.4). - albumin 2.1 > 1.7 > 1.5 - urinalysis showing proteinuria, ketonuria, protein-to-creatinine ratio 1.15 - continue thiamine, folate, MVI - B12 level wnl, folate 4.9 - Daily Chem10 to watch for refeeding syndrome, will continue to replete electrolytes - Giving kcl and Mg today - Diet was switched from heart healthy to regular - Daily intake goals should include number of meals, and Ensure Plus shakes for supplement if needed, appreciate RD assistance - Consider outpatient resources for eating disorders, including working with a therapist and nutritionist   Left arm flaccid paralysis Weakness of both  legs She reports she has been unable to move left arm or left leg for the past 6 months. CT head was negative for acute intracranial abnormality. MRI brain and c-spine were also negative. Spoke with Dr. Leonel Ramsay (Neurology), he believes that there is nothing to do in the inpatient setting. He advised that patient may benefit from EMG and nerve conduction studies as an outpatient. - Continue working with PT/OT to improve mobility/functionality - Accepted to CIR, will get discharged to their services today   Pressure Injury Wound care was consulted and determined a stage 1 nonblanchable coccyx wound over bony prominence. Measurement 1 cm x 0.8 cm. We will continue to monitor it.  - Dressing to be changed every 3 days, unless soiled     LOS: 6 days   Lytle Michaels, Medical Student 04/21/2020, 12:17 PM

## 2020-04-21 NOTE — Progress Notes (Signed)
Pt husband at nurse station asking that pt be signed out of the hospital AMA. He stated that she had asked several hours ago, but pt did not express desire to sign out AMA even when asked if there was anything that this nurse could do to assist her. Pt and pt husband given the regulations of signing out AMA. They have decided to stay at this time. Pt states that she will stay because she has been "forced", this nurse as well as Surveyor, quantity kindly advised her that we cannot force her to stay, but that she has not been released per MD at this time. Pt able to verbalize understanding.

## 2020-04-21 NOTE — Progress Notes (Signed)
Meredith Staggers, MD  Physician  Physical Medicine and Rehabilitation  PMR Pre-admission      Signed  Date of Service:  04/21/2020 11:59 AM      Related encounter: ED to Hosp-Admission (Current) from 04/15/2020 in [facility]      Signed       Show:Clear all [x] Manual[x] Template[x] Copied  Added by: [x] Cristina Gong, RN[x] Meredith Staggers, MD  [] Hover for details PMR Admission Coordinator Pre-Admission Assessment   Patient: Gabrielle Singh is an 34 y.o., female MRN: [id number] DOB: 04/19/1986 Height: 5' 4"  (162.6 cm) Weight: 49.6 kg   Insurance Information   PRIMARY: uninsured   Development worker, community:       Phone#:  Patient states her Mom has applied for disability on line for her this week. [employer]/med assist has met with patient and spouse and gave medicaid application on 30/86 due to minor children in the home   The "Data Collection Information Summary" for patients in Inpatient Rehabilitation Facilities with attached "Privacy Act [place] Records" was provided and verbally reviewed with: N/A   Emergency Contact Information         Contact Information     Name Relation Home Work [place] [phone]        Durward Fortes Mother     [phone]         Current Medical History  Patient Admitting Diagnosis: Debility   History of Present Illness: 34 year old right-handed female with history of bulimia as a teenager, rheumatoid arthritis that reportedly was diagnosed during a tele health visit, chronic anemia on no prescription medications and has not received any medical care in the past 8 years.  Presented 04/15/2020 with left-sided abdominal pain as well as intermittent left arm and leg weakness.  Patient reported this is progressed over the last 4 months.  Admission chemistries with sodium 134 potassium 3.4 BUN 6 creatinine 0.62, hemoglobin 8.2, lipase 34, TSH within normal limits.  Cranial CT/MRI scan  negative for acute changes.  MRI cervical spine unremarkable.  CT angiogram of the chest showed no evidence of pulmonary emboli.  CTA of the abdomen pelvis showed grooved pancreatitis with secondary duodenitis, although felt to be less likely but not excluded.  Thickened proximal duodenum with suspected non perforated ulceration along the posterior lateral wall of the duodenal bulb with prominent fluid within the pancreaticoduodenal groove.  Gastroenterology follow-up Dr. Loletha Carrow with recommendations of upper endoscopy of which patient declined.  She was placed on PPI 40 mg twice daily.  Tolerating a regular diet.  There was some suggestion symptoms were from underlying undiagnosed psychiatric illness contributing to her current presentation.  Iron deficiency anemia she did receive IV iron x2 and 1 unit packed red blood cells with latest hemoglobin 9.0.  In regards to patient's left arm and leg weakness CT and MRI were negative spoke with neurology services Dr. Leonel Ramsay nothing further to add on inpatient setting consider EMG outpatient.  Wound care consulted for stage I nonblanchable coccyx wound over bony prominence dressing changes as directed.  Tolerating regular diet.   Complete NIHSS TOTAL: 12   Patient's medical record from [facility] has been reviewed by the rehabilitation admission coordinator and physician.   Past Medical History  History reviewed. No pertinent past medical history.   Family History   family history is not on file.   Prior Rehab/Hospitalizations Has the patient had prior rehab or hospitalizations prior to admission? Yes   Has the  patient had major surgery during 100 days prior to admission? No               Current Medications   Current Facility-Administered Medications:  .  (feeding supplement) PROSource Plus liquid 30 mL, 30 mL, Oral, BID BM, Oda Kilts, MD, 30 mL at 04/20/20 1609 .  feeding supplement (ENSURE ENLIVE / ENSURE PLUS) liquid 237 mL,  237 mL, Oral, TID BM, Oda Kilts, MD, 237 mL at 04/21/20 1212 .  folic acid (FOLVITE) tablet 1 mg, 1 mg, Oral, Daily, Madalyn Rob, MD, 1 mg at 04/20/20 0846 .  multivitamin with minerals tablet 1 tablet, 1 tablet, Oral, Daily, Madalyn Rob, MD, 1 tablet at 04/20/20 [phone] .  pantoprazole (PROTONIX) EC tablet 40 mg, 40 mg, Oral, BID, Vena Rua, PA-C, 40 mg at 04/20/20 7829 .  polyethylene glycol (MIRALAX / GLYCOLAX) packet 17 g, 17 g, Oral, QHS, Kennedy-Smith, Colleen M, NP, 17 g at 04/18/20 2147 .  potassium chloride SA (KLOR-CON) CR tablet 40 mEq, 40 mEq, Oral, BID, Virl Axe, MD .  thiamine tablet 100 mg, 100 mg, Oral, Daily, 100 mg at 04/20/20 5621 **OR** thiamine (B-1) injection 100 mg, 100 mg, Intravenous, Daily, Madalyn Rob, MD, 100 mg at 04/16/20 3086   Patients Current Diet:     Diet Order                      Diet regular Room service appropriate? Yes; Fluid consistency: Thin  Diet effective now                      Precautions / Restrictions Precautions Precautions: Fall, Other (comment) Precaution Comments: extreme anxiety Restrictions Weight Bearing Restrictions: No    Has the patient had 2 or more falls or a fall with injury in the past year? No   Prior Activity Level Limited Community (1-2x/wk): decrease in function over past 6 months; was using RW until 1 month ago; spouse lifting   Prior Functional Level Self Care: Did the patient need help bathing, dressing, using the toilet or eating? Needed some help   Indoor Mobility: Did the patient need assistance with walking from room to room (with or without device)? Needed some help   Stairs: Did the patient need assistance with internal or external stairs (with or without device)? Needed some help   Functional Cognition: Did the patient need help planning regular tasks such as shopping or remembering to take medications? Needed some help   Home Assistive Devices / [place]  Devices/Equipment: Gilford Rile (specify type) Home Equipment: Walker - 2 wheels, Shower seat, Bedside commode   Prior Device Use: Indicate devices/aids used by the patient prior to current illness, exacerbation or injury? Walker   Current Functional Level Cognition   Overall Cognitive Status: No family/caregiver present to determine baseline cognitive functioning Orientation Level: Oriented X4 Following Commands: Follows multi-step commands inconsistently, Follows one step commands with increased time Safety/Judgement: Decreased awareness of safety, Decreased awareness of deficits General Comments: pt with poor awareness of deficits, increased time required for processing, initation and poor problem solving     Extremity Assessment (includes Sensation/Coordination)   Upper Extremity Assessment: LUE deficits/detail LUE Deficits / Details: demonstrates 3-/5 shoulder elevation, retraction, elbow extension; 1/5 shoulder flexion, elbow flexion; 2-/5 finger flexion/extension; 0/5 wrist  LUE Sensation: decreased light touch, decreased proprioception LUE Coordination: decreased fine motor, decreased gross motor  Lower Extremity Assessment: Defer to PT evaluation LLE Deficits /  Details: pt states she can't move her LLE, during evaluation pt able to assist with adduction to get to EOB as well as knee extension noted when therapist donning socks; pt states her leg will sometimes work LLE Sensation: WNL     ADLs   Overall ADL's : Needs assistance/impaired Grooming: Bed level, Wash/dry hands, Wash/dry face, Minimal assistance Grooming Details (indicate cue type and reason): assist to wash L hand  Upper Body Bathing: Moderate assistance, Bed level Upper Body Bathing Details (indicate cue type and reason): bed level bathing of UEs, with hand over hand on L side to wash R UE  Lower Body Bathing: Total assistance, Sitting/lateral leans, Bed level, +2 for physical assistance, +2 for safety/equipment Upper  Body Dressing : Maximal assistance, Sitting Upper Body Dressing Details (indicate cue type and reason): +2 required EOB  Lower Body Dressing: Total assistance, +2 for physical assistance, +2 for safety/equipment, Sitting/lateral leans, Bed level Toilet Transfer Details (indicate cue type and reason): deferred  Functional mobility during ADLs: Total assistance, +2 for physical assistance, +2 for safety/equipment General ADL Comments: pt limited by L sided weakness, impaired balance, decreased activity tolerance and anxiety      Mobility   Overal bed mobility: Needs Assistance Bed Mobility: Rolling, Supine to Sit, Sit to Supine Rolling: Mod assist, Total assist Supine to sit: Total assist, +2 for physical assistance Sit to supine: Total assist, +2 for physical assistance General bed mobility comments: pt refused despite mulitple attempts- requires +2 to reposition to [facility] and into chair position      Transfers   Overall transfer level: Needs assistance Transfers: Squat Pivot Transfers Squat pivot transfers: Mod assist General transfer comment: refused despite multiple attempts     Ambulation / Gait / Stairs / Wheelchair Mobility   Ambulation/Gait General Gait Details: unable     Posture / Balance Balance Overall balance assessment: Needs assistance Sitting-balance support: Feet supported Sitting balance-Leahy Scale: Poor Postural control: Posterior lean Standing balance-Leahy Scale: Zero     Special needs/care consideration Coccyx stage 1 noted 04/15/2020 Dietary follow up needed for nutritional support Likely need follow up with Neuro psych for ongoing counseling Needs PCP at discharge    Previous Home Environment  Living Arrangements:  (children 10 and 8)  Lives With: Spouse, Daughter (minor children) Available Help at Discharge: Family, Available 24 hours/day Type of Home: House Home Layout: Multi-level, Bed/bath upstairs Alternate Level Stairs-Rails: Right Alternate Level  Stairs-Number of Steps: 12 Home Access: Level entry Bathroom Shower/Tub: Chiropodist: Standard Bathroom Accessibility: Yes Home Care Services: No Additional Comments: pt reports spending most time upstairs, kids 8 and 10   Discharge Living Setting Plans for Discharge Living Setting: Patient's home, Lives with (comment) (spouse and 2 minor children) Type of Home at Discharge: House Discharge Home Layout: Two level, 1/2 bath on main level, Bed/bath upstairs Alternate Level Stairs-Rails: Right Alternate Level Stairs-Number of Steps: 12 Discharge Home Access: Level entry Discharge Bathroom Shower/Tub: Tub/shower unit Discharge Bathroom Toilet: Standard Discharge Bathroom Accessibility: Yes How Accessible: Accessible via walker Does the patient have any problems obtaining your medications?: Yes (Describe) (uninsured; no PCP)   Social/Family/Support Systems Patient Roles: Spouse, Parent Contact Information: spouse Roderic Palau Anticipated Caregiver: spouse and Mom Anticipated Ambulance person Information: see above Ability/Limitations of Caregiver: spouse disabled Economist Availability: 24/7 Discharge Plan Discussed with Primary Caregiver: Yes Is Caregiver In Agreement with Plan?: Yes Does Caregiver/Family have Issues with Lodging/Transportation while Pt is in Rehab?: No   Goals Patient/Family Goal  for Rehab: supervision to min asisst PT and OT Expected length of stay: ELOS 1 week trial for participation and progression would consider additional time Pt/Family Agrees to Admission and willing to participate: Yes Program Orientation Provided & Reviewed with Pt/Caregiver Including Roles  & Responsibilities: Yes   Decrease burden of Care through IP rehab admission: n/a   Possible need for SNF placement upon discharge: not anticipated   Patient Condition: I have reviewed medical records from [facility], spoken with  patient. I met with patient at the bedside for  inpatient rehabilitation assessment.  Patient will benefit from ongoing PT and OT, can actively participate in 3 hours of therapy a day 5 days of the week, and can make measurable gains during the admission.  Patient will also benefit from the coordinated team approach during an Inpatient Acute Rehabilitation admission.  The patient will receive intensive therapy as well as Rehabilitation physician, nursing, social worker, and care management interventions.  Due to bladder management, bowel management, safety, skin/wound care, disease management, medication administration, pain management and patient education the patient requires 24 hour a day rehabilitation nursing.  The patient is currently mod to max assist overall with mobility and basic ADLs.  Discharge setting and therapy post discharge at home with home health is anticipated.  Patient has agreed to participate in the Acute Inpatient Rehabilitation Program and will admit today.   Preadmission Screen Completed By:  Cleatrice Burke, 04/21/2020 12:28 PM ______________________________________________________________________   Discussed status with Dr. Naaman Plummer  on  04/21/2020 at  73 and received approval for admission today.   Admission Coordinator:  Cleatrice Burke, RN, time  4497 Date  04/21/2020    Assessment/Plan: Diagnosis: debility after multiple medical, failure to thrive 1. Does the need for close, 24 hr/day Medical supervision in concert with the patient's rehab needs make it unreasonable for this patient to be served in a less intensive setting? Yes 2. Co-Morbidities requiring supervision/potential complications: RA, anorexia/bulimia, anemia,  3. Due to bladder management, bowel management, safety, skin/wound care, disease management, medication administration, pain management and patient education, does the patient require 24 hr/day rehab nursing? Yes 4. Does the patient require coordinated care of a physician, rehab nurse,  PT, OT, and SLP to address physical and functional deficits in the context of the above medical diagnosis(es)? Yes Addressing deficits in the following areas: balance, endurance, locomotion, strength, transferring, bowel/bladder control, bathing, dressing, feeding, grooming, toileting and psychosocial support 5. Can the patient actively participate in an intensive therapy program of at least 3 hrs of therapy 5 days a week? Yes 6. The potential for patient to make measurable gains while on inpatient rehab is excellent 7. Anticipated functional outcomes upon discharge from inpatient rehab: supervision and min assist PT, supervision and min assist OT, n/a SLP 8. Estimated rehab length of stay to reach the above functional goals is: 1 week trial and more time if tolerating/showing progression 9. Anticipated discharge destination: Home 10. Overall Rehab/Functional Prognosis: good     MD Signature: Meredith Staggers, MD, [facility] 04/21/2020         Revision History                          Note Details  Author Meredith Staggers, MD File Time 04/21/2020 12:48 PM  Author Type Physician Status Signed  Last Editor Meredith Staggers, MD Service Physical Medicine and Rehabilitation

## 2020-04-21 NOTE — Progress Notes (Signed)
Inpatient Rehabilitation Medication Review by a Pharmacist  A complete drug regimen review was completed for this patient to identify any potential clinically significant medication issues.  Clinically significant medication issues were identified:  no  Check AMION for pharmacist assigned to patient if future medication questions/issues arise during this admission.   Time spent performing this drug regimen review (minutes):  [place], [place] Clinical Pharmacist 04/21/2020 9:06 PM

## 2020-04-21 NOTE — Progress Notes (Signed)
Hypoglycemic Event  CBG: 59  Treatment: 1 tube glucose gel  Symptoms: None  Follow-up CBG: Time:07:22 CBG Result:84  Possible Reasons for Event: Inadequate meal intake  Comments/MD notified:Per hypoglycemia protocol    Gabrielle Singh

## 2020-04-21 NOTE — Progress Notes (Signed)
Inpatient Rehabilitation Admissions Coordinator  Discussed with Dr. Rebeca Alert and Dr. Naaman Plummer. I have placed order for rehab consult and will meet with patient this morning.  Danne Baxter, RN, MSN Rehab Admissions Coordinator [phone] 04/21/2020 10:45 AM

## 2020-04-21 NOTE — Discharge Instructions (Signed)
You have been referred to outpatient Registered Dietitians to discuss your nutritional needs. They will be able to answer any questions you may have regarding your nutrition and will be able to provide additional support when you discharge from the hospital.

## 2020-04-22 ENCOUNTER — Inpatient Hospital Stay (HOSPITAL_COMMUNITY): Payer: Self-pay

## 2020-04-22 ENCOUNTER — Inpatient Hospital Stay (HOSPITAL_COMMUNITY): Payer: Self-pay | Admitting: Physical Therapy

## 2020-04-22 ENCOUNTER — Inpatient Hospital Stay (HOSPITAL_COMMUNITY): Payer: Self-pay | Admitting: Occupational Therapy

## 2020-04-22 LAB — GLUCOSE, CAPILLARY
Glucose-Capillary: 77 mg/dL (ref 70–99)
Glucose-Capillary: 73 mg/dL (ref 70–99)

## 2020-04-22 MED ORDER — ALPRAZOLAM 0.25 MG PO TABS: 0.2500 mg | ORAL_TABLET | Freq: Three times a day (TID) | ORAL | Status: DC | PRN

## 2020-04-22 NOTE — Progress Notes (Signed)
Patient's husband arrived at bedside with a transport wheelchair. Education again reinforced on [employer]. Patient and the husband still in agreement on leaving. Form signed and MD Naaman Plummer notified.

## 2020-04-22 NOTE — Discharge Summary (Signed)
Physician Discharge Summary  Patient ID: Gabrielle Singh MRN: [id number] DOB/AGE: 1985/10/01 34 y.o.  Admit date: 04/21/2020 Discharge date: 04/22/2020  Discharge Diagnoses:  Principal Problem:   Debility Iron deficiency anemia Left side abdominal pain Left side weakness of unknown etiology Decreased nutritional storage  Discharged Condition: Stable  Significant Diagnostic Studies: CT HEAD WO CONTRAST  Result Date: 04/15/2020 CLINICAL DATA:  Left-sided weakness EXAM: CT HEAD WITHOUT CONTRAST TECHNIQUE: Contiguous axial images were obtained from the base of the skull through the vertex without intravenous contrast. COMPARISON:  None. FINDINGS: Brain: No evidence of acute infarction, hemorrhage, hydrocephalus, extra-axial collection or mass lesion/mass effect. Vascular: No hyperdense vessel or unexpected calcification. Skull: Normal. Negative for fracture or focal lesion. Sinuses/Orbits: Mucosal thickening within the bilateral maxillary sinuses, left worse than right. Remaining paranasal sinuses and mastoid air cells are clear. Orbital structures unremarkable. Other: None. IMPRESSION: 1. No acute intracranial findings. 2. Bilateral maxillary sinus disease. Electronically Signed   By: Davina Poke D.O.   On: 04/15/2020 14:18   CT Angio Chest PE W/Cm &/Or Wo Cm  Result Date: 04/15/2020 CLINICAL DATA:  Failure to thrive, left-sided weakness, left upper quadrant pain EXAM: CT ANGIOGRAPHY CHEST CT ABDOMEN AND PELVIS WITH CONTRAST TECHNIQUE: Multidetector CT imaging of the chest was performed using the standard protocol during bolus administration of intravenous contrast. Multiplanar CT image reconstructions and MIPs were obtained to evaluate the vascular anatomy. Multidetector CT imaging of the abdomen and pelvis was performed using the standard protocol during bolus administration of intravenous contrast. CONTRAST:  171mL OMNIPAQUE IOHEXOL 350 MG/ML SOLN COMPARISON:  None. FINDINGS: CTA CHEST  FINDINGS Cardiovascular: Satisfactory opacification of the pulmonary arteries to the segmental level. No evidence of pulmonary embolism. Thoracic aorta is normal in course and caliber. Four vessel arch, an anatomic variant. Normal heart size. No pericardial effusion. Mediastinum/Nodes: No enlarged mediastinal, hilar, or axillary lymph nodes. Thyroid gland, trachea, and esophagus demonstrate no significant findings. Lungs/Pleura: Lungs are clear. No pleural effusion or pneumothorax. Musculoskeletal: Mild dextrocurvature of the thoracic spine. No acute osseous abnormality. No chest wall abnormality. Review of the MIP images confirms the above findings. CT ABDOMEN and PELVIS FINDINGS Hepatobiliary: No focal liver abnormality is seen. No gallstones, gallbladder wall thickening, or biliary dilatation. Pancreas: No definite pancreatic parenchymal edema. There is fluid within the pancreaticoduodenal groove. No pancreatic ductal dilatation. Spleen: Normal in size without focal abnormality. Adrenals/Urinary Tract: Unremarkable adrenal glands. Kidneys enhance symmetrically without focal lesion, stone, or hydronephrosis. Ureters are nondilated. Urinary bladder appears unremarkable. Stomach/Bowel: Small sliding type hiatal hernia. Mildly thickened rugal folds within the distal stomach with mucosal hyperenhancement. The proximal duodenum is also thickened with suspected non perforated ulceration along the posterolateral wall of the duodenal bulb (series 3, image 30; series 7, image 54) with prominent fluid within the pancreaticoduodenal groove. Appendix appears unremarkable (series 6, images 34-42). No focal colonic wall thickening or inflammatory changes. Prominent stool ball within the rectum measuring 5.8 cm in diameter. Vascular/Lymphatic: No acute vascular abnormality. There are a few mildly prominent pancreaticoduodenal lymph nodes, likely reactive. No abdominopelvic lymphadenopathy. Reproductive: Anteverted uterus with  probable nabothian cysts. Physiologic cyst or follicle in the region of the right ovary. Adnexal regions otherwise unremarkable. Other: No organized abdominopelvic fluid collection. No pneumoperitoneum. Musculoskeletal: Degenerative changes of the bilateral SI joints. No acute osseous findings. Review of the MIP images confirms the above findings. IMPRESSION: 1. No evidence of pulmonary embolism or acute cardiopulmonary disease. 2. Thickened proximal duodenum with suspected non-perforated ulceration along the posterolateral  wall of the duodenal bulb with prominent fluid within the pancreaticoduodenal groove. Findings are concerning for duodenitis/peptic ulcer disease. Consider endoscopic evaluation. 3. Groove pancreatitis with secondary duodenitis, although felt to be less likely, is not excluded. Correlation with serum pancreatic enzymes is recommended. 4. Prominent stool ball within the rectum measuring 5.8 cm in diameter. Correlate for constipation. Electronically Signed   By: Davina Poke D.O.   On: 04/15/2020 16:38   MR BRAIN W WO CONTRAST  Result Date: 04/17/2020 CLINICAL DATA:  Left arm weakness.  History of multiple sclerosis. EXAM: MRI HEAD WITHOUT AND WITH CONTRAST MRI CERVICAL SPINE WITHOUT AND WITH CONTRAST TECHNIQUE: Multiplanar, multiecho pulse sequences of the brain and surrounding structures, and cervical spine, to include the craniocervical junction and cervicothoracic junction, were obtained without and with intravenous contrast. CONTRAST:  4.1mL GADAVIST GADOBUTROL 1 MMOL/ML IV SOLN COMPARISON:  None. FINDINGS: MRI HEAD FINDINGS Brain: No acute infarct, acute hemorrhage or extra-axial collection. Normal white matter signal. Normal volume of CSF spaces. Extensive magnetic susceptibility effect consistent with recent iron administration. Normal midline structures. Vascular: Normal flow voids. Skull and upper cervical spine: Normal marrow signal. Sinuses/Orbits: Negative. Other: None. MRI  CERVICAL SPINE FINDINGS Alignment: Physiologic. Vertebrae: No fracture, evidence of discitis, or bone lesion. Cord: Normal signal and morphology. Posterior Fossa, vertebral arteries, paraspinal tissues: Negative. Disc levels: No disc herniation or stenosis. IMPRESSION: 1. Normal MRI of the brain and cervical spine. 2. Extensive magnetic susceptibility effect consistent with recent iron administration. Electronically Signed   By: Ulyses Jarred M.D.   On: 04/17/2020 22:33   MR CERVICAL SPINE W WO CONTRAST  Result Date: 04/17/2020 CLINICAL DATA:  Left arm weakness.  History of multiple sclerosis. EXAM: MRI HEAD WITHOUT AND WITH CONTRAST MRI CERVICAL SPINE WITHOUT AND WITH CONTRAST TECHNIQUE: Multiplanar, multiecho pulse sequences of the brain and surrounding structures, and cervical spine, to include the craniocervical junction and cervicothoracic junction, were obtained without and with intravenous contrast. CONTRAST:  4.48mL GADAVIST GADOBUTROL 1 MMOL/ML IV SOLN COMPARISON:  None. FINDINGS: MRI HEAD FINDINGS Brain: No acute infarct, acute hemorrhage or extra-axial collection. Normal white matter signal. Normal volume of CSF spaces. Extensive magnetic susceptibility effect consistent with recent iron administration. Normal midline structures. Vascular: Normal flow voids. Skull and upper cervical spine: Normal marrow signal. Sinuses/Orbits: Negative. Other: None. MRI CERVICAL SPINE FINDINGS Alignment: Physiologic. Vertebrae: No fracture, evidence of discitis, or bone lesion. Cord: Normal signal and morphology. Posterior Fossa, vertebral arteries, paraspinal tissues: Negative. Disc levels: No disc herniation or stenosis. IMPRESSION: 1. Normal MRI of the brain and cervical spine. 2. Extensive magnetic susceptibility effect consistent with recent iron administration. Electronically Signed   By: Ulyses Jarred M.D.   On: 04/17/2020 22:33   CT Abdomen Pelvis W Contrast  Result Date: 04/15/2020 CLINICAL DATA:   Failure to thrive, left-sided weakness, left upper quadrant pain EXAM: CT ANGIOGRAPHY CHEST CT ABDOMEN AND PELVIS WITH CONTRAST TECHNIQUE: Multidetector CT imaging of the chest was performed using the standard protocol during bolus administration of intravenous contrast. Multiplanar CT image reconstructions and MIPs were obtained to evaluate the vascular anatomy. Multidetector CT imaging of the abdomen and pelvis was performed using the standard protocol during bolus administration of intravenous contrast. CONTRAST:  163mL OMNIPAQUE IOHEXOL 350 MG/ML SOLN COMPARISON:  None. FINDINGS: CTA CHEST FINDINGS Cardiovascular: Satisfactory opacification of the pulmonary arteries to the segmental level. No evidence of pulmonary embolism. Thoracic aorta is normal in course and caliber. Four vessel arch, an anatomic variant. Normal heart size. No  pericardial effusion. Mediastinum/Nodes: No enlarged mediastinal, hilar, or axillary lymph nodes. Thyroid gland, trachea, and esophagus demonstrate no significant findings. Lungs/Pleura: Lungs are clear. No pleural effusion or pneumothorax. Musculoskeletal: Mild dextrocurvature of the thoracic spine. No acute osseous abnormality. No chest wall abnormality. Review of the MIP images confirms the above findings. CT ABDOMEN and PELVIS FINDINGS Hepatobiliary: No focal liver abnormality is seen. No gallstones, gallbladder wall thickening, or biliary dilatation. Pancreas: No definite pancreatic parenchymal edema. There is fluid within the pancreaticoduodenal groove. No pancreatic ductal dilatation. Spleen: Normal in size without focal abnormality. Adrenals/Urinary Tract: Unremarkable adrenal glands. Kidneys enhance symmetrically without focal lesion, stone, or hydronephrosis. Ureters are nondilated. Urinary bladder appears unremarkable. Stomach/Bowel: Small sliding type hiatal hernia. Mildly thickened rugal folds within the distal stomach with mucosal hyperenhancement. The proximal duodenum  is also thickened with suspected non perforated ulceration along the posterolateral wall of the duodenal bulb (series 3, image 30; series 7, image 54) with prominent fluid within the pancreaticoduodenal groove. Appendix appears unremarkable (series 6, images 34-42). No focal colonic wall thickening or inflammatory changes. Prominent stool ball within the rectum measuring 5.8 cm in diameter. Vascular/Lymphatic: No acute vascular abnormality. There are a few mildly prominent pancreaticoduodenal lymph nodes, likely reactive. No abdominopelvic lymphadenopathy. Reproductive: Anteverted uterus with probable nabothian cysts. Physiologic cyst or follicle in the region of the right ovary. Adnexal regions otherwise unremarkable. Other: No organized abdominopelvic fluid collection. No pneumoperitoneum. Musculoskeletal: Degenerative changes of the bilateral SI joints. No acute osseous findings. Review of the MIP images confirms the above findings. IMPRESSION: 1. No evidence of pulmonary embolism or acute cardiopulmonary disease. 2. Thickened proximal duodenum with suspected non-perforated ulceration along the posterolateral wall of the duodenal bulb with prominent fluid within the pancreaticoduodenal groove. Findings are concerning for duodenitis/peptic ulcer disease. Consider endoscopic evaluation. 3. Groove pancreatitis with secondary duodenitis, although felt to be less likely, is not excluded. Correlation with serum pancreatic enzymes is recommended. 4. Prominent stool ball within the rectum measuring 5.8 cm in diameter. Correlate for constipation. Electronically Signed   By: Davina Poke D.O.   On: 04/15/2020 16:38    Labs:  Basic Metabolic Panel: Recent Labs  Lab 04/16/20 0240 04/17/20 0332 04/18/20 1211 04/19/20 0711 04/19/20 1730 04/20/20 0104 04/21/20 0555 04/21/20 0915  NA 136 135  --  139  --  140 138  --   K 3.8 3.6  --  4.1  --  3.2* 3.4*  --   CL 103 101  --  104  --  107 104  --   CO2 25 24   --  27  --  26 29  --   GLUCOSE 67* 56* 64* 86  --  93 81  --   BUN <5* <5*  --  <5*  --  5* 6  --   CREATININE 0.56 0.64  --  0.57  --  0.59 0.52  --   CALCIUM 8.5* 8.2*  --  8.3*  --  8.2* 8.5*  --   MG 1.5*  --   --   --  1.6* 1.5*  --  1.8  PHOS 4.3  --   --   --  4.3 4.2  --   --     CBC: Recent Labs  Lab 04/16/20 0240 04/17/20 0332 04/19/20 0711 04/20/20 0104 04/21/20 0555  WBC 5.3   < > 4.6 4.8 4.7  NEUTROABS 2.4  --  2.5  --   --   HGB 7.9*   < >  8.4* 8.6* 9.0*  HCT 25.9*   < > 27.2* 28.1* 30.1*  MCV 89.6   < > 89.8 90.1 92.0  PLT 312   < > 264 262 281   < > = values in this interval not displayed.    CBG: Recent Labs  Lab 04/21/20 0722 04/21/20 1147 04/21/20 2101 04/22/20 0552 04/22/20 1147  GLUCAP 84 77 77 77 73    Brief HPI:   Gabrielle Singh is a 34 y.o. right-handed female with history of bulimia as a teenager rheumatoid arthritis that reportedly was diagnosed during a telehealth visit although on no medications, chronic anemia.  Patient not received medical care in the past 8 years.  Presented 04/15/2020 with left-sided weakness abdominal pain as well as intermittent left-sided weakness.  Patient reportedly notes this is progressed over the last 4 months.  Admission chemistry sodium 134 potassium 3.4 BUN 6 creatinine 0.62 hemoglobin 8.2 lipase 34 TSH within normal limits.  Cranial CT scan MRI negative for acute changes MRI cervical spine unremarkable.  CT angiogram of the chest with no pulmonary emboli.  CTA of the abdomen pelvis showed grooved pancreatitis with secondary duodenitis although felt to be less likely but not excluded.  Thickened proximal duodenum with suspected nonperforated ulceration along the posterior lateral wall of the duodenal bulb with prominent fluid within the pancreaticoduodenal groove.  Gastroenterology consulted recommendations of upper endoscopy of which patient declined.  She was placed on PPI 40 mg twice daily.  Maintain on a regular  diet.  There was some suggestion symptoms were from underlying undiagnosed psychiatric illness contributing to her current presentation.  Iron deficiency anemia she did receive IV iron x2 with 1 unit of packed red blood cells with latest hemoglobin 9.0.  In regards to patient's left arm and leg weakness CT and MRI were negative spoke with neurology services Dr. Leonel Ramsay nothing further to add on inpatient basis consider EMG outpatient.  There was a documented wound stage I nonblanchable coccyx over bony prominence dressing changes as directed.  Patient was admitted for a comprehensive rehab program.   Hospital Course: [place] Kreitzer was admitted to rehab 04/21/2020 for inpatient therapies to consist of PT, ST and OT at least three hours five days a week. Past admission physiatrist, therapy team and rehab RN have worked together to provide customized collaborative inpatient rehab.  Pertaining to patient's debility related iron deficiency anemia failure to thrive as well as left-sided abdominal pain.  Left upper extremity weakness of unknown origin CT MRI negative.  She had refused any GI work-up with EGD noted.  SCDs for DVT prophylaxis.  There was some suspect overlying psychiatric issues impacting functional deficits.  It was recommended she could follow-up with neuropsychology.  In regards to the left side abdominal pain she remained on PPI again declined EGD.  Latest hemoglobin 9.0.   Blood pressures were monitored on TID basis and stable and monitored     Rehab course: During patient's stay in rehab weekly team conferences were held to monitor patient's progress, set goals and discuss barriers to discharge. At admission, patient required moderate assist squat pivot transfers total assist supine to sit.  Moderate assist upper body bathing total assist lower body bathing max assist upper body dressing total assist lower body dressing.  It was noted patient needing ongoing encouragement to  participate  Physical exam.  Blood pressure 104/69 pulse 79 temperature 99 respirations 18 oxygen saturation 95% room air Constitutional.  Mood was flat. HEENT Head.  Normocephalic and atraumatic  Eyes.  Pupils round and reactive to light no discharge without nystagmus Neck.  Supple nontender no JVD without thyromegaly Cardiac regular rate rhythm no extra sounds or murmur heard Abdomen.  Soft nontender positive bowel sounds without rebound Respiratory effort normal no respiratory distress without wheeze Musculoskeletal. Comments.  Bilateral right greater than left heel cord tightness. Skin.  Stage I small area coccyx sacrum with foam dressing in place Neurologic alert mood was flat oriented to person place and time.  Follows commands.  Right upper extremity 4/5 left lower extremity 1-2/5 with some inconsistencies left lower extremity 1-2/5 proximal distal right weaker than left.  No focal sensory deficits  Marlana Salvage  has had improvement in activity tolerance, balance, postural control as well as ability to compensate for deficits. Marlana Salvage has had improvement in functional use RUE/LUE  and RLE/LLE as well as improvement in awareness.  Patient ultimately declined of physical and occupational therapy and she insisted on being discharged to home.  Multiple discussions were held with patient in regards to maintaining therapy regimen.  Significant other contacted about these developments.  Patient did leave AMA.       Disposition: Discharge to home    Diet: Regular  Special Instructions: Discussed no driving smoking or alcohol   Allergies as of 04/22/2020   No Known Allergies     Medication List    ASK your doctor about these medications   feeding supplement Liqd Take 237 mLs by mouth 3 (three) times daily between meals.   (feeding supplement) PROSource Plus liquid Take 30 mLs by mouth 2 (two) times daily between meals.   folic acid 1 MG tablet Commonly known as: FOLVITE Take 1 tablet  (1 mg total) by mouth daily.   multivitamin with minerals Tabs tablet Take 1 tablet by mouth daily.   pantoprazole 40 MG tablet Commonly known as: PROTONIX Take 1 tablet (40 mg total) by mouth 2 (two) times daily.   polyethylene glycol 17 g packet Commonly known as: MIRALAX / GLYCOLAX Take 17 g by mouth at bedtime.   thiamine 100 MG tablet Take 1 tablet (100 mg total) by mouth daily.        Signed: Lavon Paganini [facility] 04/22/2020, 8:54 PM

## 2020-04-22 NOTE — Plan of Care (Signed)
Patient left AMA.  Problem: Consults Goal: RH GENERAL PATIENT EDUCATION Description: See Patient Education module for education specifics. Outcome: Not Met (left AMA) Goal: Skin Care Protocol Initiated - if Braden Score 18 or less Description: If consults are not indicated, leave blank or document N/A Outcome: Not Met (left AMA) Goal: Nutrition Consult-if indicated Outcome: Not Met (left AMA) Goal: Diabetes Guidelines if Diabetic/Glucose > 140 Description: If diabetic or lab glucose is > 140 mg/dl - Initiate Diabetes/Hyperglycemia Guidelines & Document Interventions  Outcome: Not Met (left AMA)   Problem: RH BOWEL ELIMINATION Goal: RH STG MANAGE BOWEL WITH ASSISTANCE Description: STG Manage Bowel with Assistance. Outcome: Not Met (left AMA) Goal: RH STG MANAGE BOWEL W/MEDICATION W/ASSISTANCE Description: STG Manage Bowel with Medication with  supervision. Outcome: Not Met (left AMA)   Problem: RH BLADDER ELIMINATION Goal: RH STG MANAGE BLADDER WITH ASSISTANCE Description: STG Manage Bladder With Assistance Outcome: Not Met (left AMA) Goal: RH STG MANAGE BLADDER WITH EQUIPMENT WITH ASSISTANCE Description: STG Manage Bladder With Equipment With Assistance Outcome: Not Met (left AMA)   Problem: RH SKIN INTEGRITY Goal: RH STG SKIN FREE OF INFECTION/BREAKDOWN Outcome: Not Met (left AMA Goal: RH STG MAINTAIN SKIN INTEGRITY WITH ASSISTANCE Description: STG Maintain Skin Integrity With Assistance. Outcome: Not Met (left AMA) Goal: RH STG ABLE TO PERFORM INCISION/WOUND CARE W/ASSISTANCE Description: STG Able To Perform Incision/Wound Care With Assistance. Outcome: Not Met (left AMA)   Problem: RH SAFETY Goal: RH STG ADHERE TO SAFETY PRECAUTIONS W/ASSISTANCE/DEVICE Description: STG Adhere to Safety Precautions With Assistance/Device. Outcome: Not Met (left AMA)   Problem: RH PAIN MANAGEMENT Goal: RH STG PAIN MANAGED AT OR BELOW PT'S PAIN GOAL Outcome: Not Met (left AMA)    Problem: RH KNOWLEDGE DEFICIT GENERAL Goal: RH STG INCREASE KNOWLEDGE OF SELF CARE AFTER HOSPITALIZATION Outcome: Not Met (left AMA)

## 2020-04-22 NOTE — Progress Notes (Signed)
Occupational Therapy Note  Patient Details  Name: Gabrielle Singh MRN: [id number] Date of Birth: 28-Dec-1985   Attempted to see pt for OT eval, however pt ultimately declines any participation despite education on CIR benefits/level of intensity of muli-discipline care, CLOF, decreasing BOC on husband, DME needs, coordinating [place] therapies and improving independence. Pt states tearfully, "I just want to go home, no one understands me." Pt continues to decline and takes multiple phone calls indicating husband and friend on on their way with a wheelchair. Exited with pt remaining in bed.  [name] 04/22/2020, 12:32 PM

## 2020-04-22 NOTE — Progress Notes (Signed)
Initial Nutrition Assessment  RD working remotely.  DOCUMENTATION CODES:   Not applicable  INTERVENTION:   -Continue 30 ml Prosource Plus BID, each supplement provides 100 kcals and 15 grams protein -Continue Ensure Enlive po TID, each supplement provides 350 kcal and 20 grams of protein -Continue MVI with minerals daily  NUTRITION DIAGNOSIS:   Inadequate oral intake related to decreased appetite as evidenced by meal completion < 50%.  GOAL:   Patient will meet greater than or equal to 90% of their needs  MONITOR:   PO intake, Supplement acceptance, Labs, Weight trends, Skin, I & O's  REASON FOR ASSESSMENT:   Malnutrition Screening Tool    ASSESSMENT:   Carlyn Lemke is a 34 year old right-handed female with history of bulimia as a teenager, rheumatoid arthritis that reportedly was diagnosed during a telehealth visit, chronic anemia on no prescription medications and has not received any medical care in the past 8 years.  Presented 04/15/2020 with left-sided abdominal pain as well as intermittent left arm and leg weakness.  Patient reported this is progressed over the last 4 months.  Pt admitted with debilitysecondary to iron deficiency anemiaand failure to thriveas well as left-sided abdominal pain.   Attempted to speak with pt via call to hospital room phone, however, unable to reach.   Per RN notes, pt has been very emotional and desiring to leave AMA, however, pt is now agreeable to stay in the hospital at this time.   No meal completions recorded at this time. Pt was seen on 04/20/20 by acute care RD; pt has history of disordered eating patterns, often going several days without eating PTA. Meal completion prior to rehab transfer was 0-50%. She often does not like the hospital food and family members sometimes bring in her outside food to consume (previous meal completions 0-50%). Pt likes Ensure supplements and has been taking Prosurce Plus.   Pt was identified  with moderate malnutrition during acute hospitalization; RD suspects this is ongoing, however, unable to identify at this time. Recommend continue regular diet in order for pt to have the greatest variety of food selections.   No wt hx available to assess weight trends at this time.   Medications reviewed and include miralax and thiamine.   Labs reviewed: CBGS: 77.   Diet Order:   Diet Order            Diet regular Room service appropriate? Yes; Fluid consistency: Thin  Diet effective now                 EDUCATION NEEDS:   No education needs have been identified at this time  Skin:  Skin Integrity Issues:: Stage I Stage I: coccyx Stage II: -  Last BM:  04/20/20  Height:   Ht Readings from Last 1 Encounters:  04/21/20 5\' 4"  (1.626 m)    Weight:   Wt Readings from Last 1 Encounters:  04/21/20 49.6 kg    Ideal Body Weight:  54.5 kg  BMI:  Body mass index is 18.77 kg/m.  Estimated Nutritional Needs:   Kcal:  9562-1308  Protein:  85-100 grams  Fluid:  > 1.7 L    Loistine Chance, RD, LDN, [place] Registered Dietitian II Certified Diabetes Care and Education Specialist Please refer to [facility] for RD and/or RD on-call/weekend/after hours pager

## 2020-04-22 NOTE — Evaluation (Signed)
Physical Therapy Assessment and Plan  Patient Details  Name: Gabrielle Singh MRN: [id number] Date of Birth: 01/05/1986  PT Diagnosis: Abnormality of gait, Difficulty walking, Hemiparesis non-dominant, Hypotonia, Impaired sensation, Muscle weakness and Pain in L LE Rehab Potential: Poor ELOS: No POC initiated as pt deciding to leave AMA   Today's Date: 04/22/2020 PT Individual Time: 0910-0925 PT Individual Time Calculation (min): 15 min    Hospital Problem: Principal Problem:   Debility   Past Medical History: History reviewed. No pertinent past medical history. Past Surgical History: History reviewed. No pertinent surgical history.  Assessment & Plan Clinical Impression: Patient is a 34 y.o. year old right-handed female with history ofbulimia as a teenager,rheumatoid arthritis that reportedly was diagnosed during a telehealth visit, chronic anemia on no prescription medicationsand has not received any medical care in the past 8 years. Presented 04/15/2020 with left-sided abdominal pain as well as intermittent left armand legweakness. Patient reported this is progressed over the last 4 months.Admission chemistries with sodium 134 potassium 3.4 BUN 6 creatinine 0.62, hemoglobin 8.2, lipase 34, TSH within normal limits. Cranial CT/MRIscan negative for acute changes.MRI cervical spine unremarkable. CT angiogram of the chest showed no evidence of pulmonary emboli. CTA of the abdomen pelvis showed grooved pancreatitis with secondary duodenitis,although felt to be less likely but not excluded. Thickened proximal duodenum with suspected nonperforated ulceration along the posterior lateral wall of the duodenal bulb with prominent fluid within the pancreaticoduodenal groove.Gastroenterology follow-up Dr. Pura Spice recommendations of upper endoscopy of which patient declined. She was placed on PPI 40 mg twice daily. Tolerating a regular diet. There was some suggestion symptoms were  from underlying undiagnosed psychiatric illness contributing to her current presentation. Iron deficiency anemia she did receive IV iron x2 and 1 unit packed red blood cells with latest hemoglobin 9.0.In regards to patient's left arm and leg weakness CT and MRI were negative spoke with neurology services Dr. Leonel Ramsay nothing further to add on inpatient setting consider EMG outpatient. Wound care consulted for stage I nonblanchable coccyx wound over bony prominence dressing changes as directed. Tolerating regular diet. Therapy evaluations completed with recommendations of physical medicine rehab consult. Patient transferred to CIR on 04/21/2020 .   Patient currently requires total assist with mobility secondary to muscle weakness and muscle paralysis, decreased cardiorespiratoy endurance, impaired timing and sequencing, abnormal tone and unbalanced muscle activation and decreased sitting balance, decreased standing balance, decreased postural control, hemiplegia and decreased balance strategies.  Prior to hospitalization, patient required mod-max physical assistance with all mobility and lived with Spouse, Daughter in a House home. Pt reports that she required assistance from her husband to transfer from bed to chair and that she was limited to only very short (~22f) gait distances using his assistance. Home access is  Level entry.  Patient will benefit from skilled PT intervention to maximize safe functional mobility, minimize fall risk and decrease caregiver burden for planned discharge home with 24 hour assist.  Anticipate patient will [facility]  PT - End of Session Endurance Deficit: Yes PT Assessment Rehab Potential (ACUTE/IP ONLY): Poor PT Barriers to Discharge: Inaccessible home environment;Home environment access/layout;Wound Care;Weight;Nutrition means PT Patient demonstrates impairments in the following area(s):  Balance;Motor;Safety;Behavior;Nutrition;Sensory;Edema;Pain;Skin Integrity;Endurance;Perception PT Transfers Functional Problem(s): Bed Mobility;Bed to Chair;Car PT Locomotion Functional Problem(s): Ambulation;Stairs;Wheelchair Mobility PT Plan PT Duration Estimated Length of Stay: No POC initiated as pt deciding to leave AMA PT Recommendation Recommendations for Other Services: Neuropsych consult   PT Evaluation Precautions/Restrictions Precautions Precautions: Fall;Other (comment)  Precaution Comments: extreme anxiety, L hemiplegia Restrictions Weight Bearing Restrictions: No Pain  Pain Assessment Pain Scale: 0-10 Pain Score: 8  Pain Type: Acute pain Pain Location: Leg Pain Orientation: Left Pain Descriptors / Indicators: Other (Comment) (reports that her L foot turns in and that causes pain from positioning during the night) Home Living/Prior Functioning Home Living Available Help at Discharge: Family;Available 24 hours/day Type of Home: House Home Access: Level entry Home Layout: Multi-level;Bed/bath upstairs Alternate Level Stairs-Number of Steps: 12 Alternate Level Stairs-Rails: Right  Lives With: Spouse;Daughter (8y.o. and 10y.o.) Prior Function Level of Independence: Needs assistance with gait;Needs assistance with tranfers;Needs assistance with homemaking (uses RW)  Able to Take Stairs?: Yes (scoots up on her bottom) Comments: reports needs assistance getting OOB and intermittent assist coming to stand; reports hasn't been able to stand for ~16monthdue to her L foot "just goes the other way" but prior to that required assistance from 1 person using RW to ambulate short distances in the home Perception  Perception Perception: Impaired Praxis Praxis: Impaired Praxis Impairment Details: Initiation;Motor planning  Cognition Overall Cognitive Status: No family/caregiver present to determine baseline cognitive functioning Arousal/Alertness: Awake/alert Orientation  Level: Oriented X4 Attention: Focused Focused Attention: Appears intact Sensation Sensation Light Touch: Impaired Detail Peripheral sensation comments: unable to sense light touch on B LEs Light Touch Impaired Details: Absent RLE;Absent LLE Hot/Cold: Not tested Proprioception: Not tested Stereognosis: Not tested Coordination Gross Motor Movements are Fluid and Coordinated: No Coordination and Movement Description: impaired due to generalized weakness and L hemiparesis (UE more impaired than LE) Motor  Motor Motor: Hemiplegia;Abnormal postural alignment and control Motor - Skilled Clinical Observations: L hemiplegia (UE more impaired than LE)   Trunk/Postural Assessment    Unable to advance to sitting EOB to assess trunk control and posture. Balance   Unable to advance to sitting EOB to assess sitting balance. Extremity Assessment      RLE Assessment RLE Assessment: Exceptions to [facility]assive Range of Motion (PROM) Comments: WFL assessed in supine RLE Strength Right Hip Flexion: 2-/5 Right Hip ABduction: 2+/5 Right Hip ADduction: 2+/5 Right Knee Flexion: 2/5 Right Knee Extension: 2+/5 Right Ankle Dorsiflexion: 0/5 Right Ankle Plantar Flexion: 1/5 LLE Assessment LLE Assessment: Exceptions to W[facility] CenterPassive Range of Motion (PROM) Comments: WFL assessed in supine LLE Strength Left Hip Flexion: 2-/5 Left Hip ABduction: 2/5 Left Hip ADduction: 2/5 Left Knee Flexion: 2/5 Left Knee Extension: 2/5 Left Ankle Dorsiflexion: 0/5 Left Ankle Plantar Flexion: 1/5  Care Tool Care Tool Bed Mobility Roll left and right activity Roll left and right activity did not occur: Refused      Sit to lying activity Sit to lying activity did not occur: Refused      Lying to sitting edge of bed activity Lying to sitting edge of bed activity did not occur: Refused       Care Tool Transfers Sit to stand transfer Sit to stand activity did not occur: Refused      Chair/bed transfer  Chair/bed transfer activity did not occur: RProducer, television/film/videotransfer activity did not occur: RSocial workertransfer activity did not occur: Safety/medical concerns        Care Tool Locomotion Ambulation Ambulation activity did not occur: Safety/medical concerns        Walk 10 feet activity Walk 10 feet activity did not occur: Safety/medical concerns       Walk 50 feet with  2 turns activity Walk 50 feet with 2 turns activity did not occur: Safety/medical concerns      Walk 150 feet activity Walk 150 feet activity did not occur: Safety/medical concerns      Walk 10 feet on uneven surfaces activity Walk 10 feet on uneven surfaces activity did not occur: Safety/medical concerns      Stairs Stair activity did not occur: Safety/medical concerns        Walk up/down 1 step activity Walk up/down 1 step or curb (drop down) activity did not occur: Safety/medical concerns     Walk up/down 4 steps activity did not occuR: Safety/medical concerns  Walk up/down 4 steps activity      Walk up/down 12 steps activity Walk up/down 12 steps activity did not occur: Safety/medical concerns      Pick up small objects from floor Pick up small object from the floor (from standing position) activity did not occur: Safety/medical concerns      Wheelchair Will patient use wheelchair at discharge?: Yes (TBD)          Wheel 50 feet with 2 turns activity      Wheel 150 feet activity        Refer to Care Plan for Long Term Goals - No long term goals set due to pt deciding to leave AMA.   SHORT TERM GOAL WEEK 1  No short term goals set due to pt deciding to leave AMA.   Recommendations for other services: Neuropsych  Skilled Therapeutic Intervention Evaluation initiated (see details above) with patient education regarding purpose of PT evaluation and general CIR information. Pt received supine in bed, tearful with RN exiting upon therapist arrival. Pt  states "I want to go home" and therapist educated pt on purpose of PT evaluation and pt agreeable. Performed above assessment with pt then reporting need to void bladder - pt currently has purewick in place. Provided encouragement to attempt bed pan or transfer to [facility] - pt declining bed pan stating it hurts her coccyx wound and despite therapist educating on how to perform lateral scoot transfer to bariatric BSC, pt declined - pt requesting for some privacy to attempt voiding - stepped out of room for 3 minutes and during that time pt's husband called and pt decided to leave AMA. Pt left supine in bed with Tomeka, RN present to assume care of patient. POC modified to reflect pt's decision and no STGs or LTGs set at this time due to pending discharge.    Mobility Bed Mobility Bed Mobility: Not assessed  Pt declining performing bed mobility.  Locomotion  Gait Ambulation: No  Not assessed.   Discharge Criteria: Patient will be discharged from PT if patient refuses treatment 3 consecutive times without medical reason, if treatment goals not met, if there is a change in medical status, if patient makes no progress towards goals or if patient is discharged from hospital.  The above assessment was discussed and mutually agreed upon: by patient  Tawana Scale , PT, DPT, CSRS  04/22/2020, 7:58 AM

## 2020-04-22 NOTE — Progress Notes (Addendum)
Pt didn't sleep last night. Upset about being here despite MULTIPLE discussions yesterday about coming and agreeing to do so. Pt refusing to participate in therapies and insisting that she be discharged. Myself as well as numerous other staff have tried to convince her otherwise. Significant other contacted about these developments. He seems to be aware of the situation already and will take her home AMA.   Meredith Staggers, MD, [facility] 04/22/2020     ADDENDUM: Spoke to husband. He has changed his mind and doesn't feel they're ready to go home, needing equipment, f/u, etc. Would like her to stay. I told him that I had only agreed to let her go AMA when I was told he was on board with her leaving. I do not believe she's capable of making decision on her own.    ADDENDUM II: Husband apparently found a wheelchair. Pt continues to refuse to participate. He is now taking her home.

## 2020-04-22 NOTE — IPOC Note (Addendum)
Will complete IPOC pending her LOS and participation.

## 2020-04-22 NOTE — Progress Notes (Signed)
Patient presented with tears in eyes during morning rounds. Patient expressed that she missed her kids. Patient was consoled by oncoming nurse for morning shift. Upon bringing patient her morning due medication patient refused all medication except prosource protein. Patient continuously stated that she wanted to go home and that she did not want to come to rehab in the first place. Attending MD informed of refusal of medications and want for AMA release. Charge nurse notified of want of AMA release. Spouse called and informed of patients wishes. Spouse talked to patient regarding concerns of leaving. Patient informed of AMA policy. Patient stated that she is waiting for spouse and sister to get things together. Will continue to monitor situation.  Sanda Linger, LPN

## 2020-04-24 ENCOUNTER — Inpatient Hospital Stay (HOSPITAL_COMMUNITY): Payer: Self-pay

## 2020-04-24 LAB — HEMOGLOBIN A1C
Hgb A1c MFr Bld: 4.7 % — ABNORMAL LOW (ref 4.8–5.6)
Mean Plasma Glucose: 88 mg/dL

## 2020-04-24 NOTE — Progress Notes (Signed)
Inpatient Rehabilitation  Patient information reviewed and entered into eRehab system by Rosangelica Pevehouse M. Roberto Hlavaty, M.A., CCC/SLP, PPS Coordinator.  Information including medical coding, functional ability and quality indicators will be reviewed and updated through discharge.    

## 2020-05-01 LAB — SULFONYLUREA HYPOGLYCEMICS PANEL, SERUM
Acetohexamide: NEGATIVE ug/mL (ref 20–60)
Chlorpropamide: NEGATIVE ug/mL (ref 75–250)
Glimepiride: NEGATIVE ng/mL (ref 80–250)
Glipizide: NEGATIVE ng/mL (ref 200–1000)
Glyburide: NEGATIVE ng/mL
Nateglinide: NEGATIVE ng/mL
Repaglinide: NEGATIVE ng/mL
Tolazamide: NEGATIVE ug/mL
Tolbutamide: NEGATIVE ug/mL (ref 40–100)

## 2020-05-03 ENCOUNTER — Inpatient Hospital Stay: Payer: Self-pay | Admitting: Physician Assistant

## 2020-10-23 ENCOUNTER — Emergency Department (HOSPITAL_COMMUNITY): Payer: Self-pay

## 2020-10-23 ENCOUNTER — Inpatient Hospital Stay (HOSPITAL_COMMUNITY): Payer: Self-pay

## 2020-10-23 ENCOUNTER — Encounter (HOSPITAL_COMMUNITY): Payer: Self-pay

## 2020-10-23 ENCOUNTER — Inpatient Hospital Stay (HOSPITAL_COMMUNITY)
Admission: EM | Admit: 2020-10-23 | Discharge: 2020-11-22 | DRG: 871 | Disposition: E | Payer: Self-pay | Attending: Critical Care Medicine | Admitting: Critical Care Medicine

## 2020-10-23 DIAGNOSIS — R739 Hyperglycemia, unspecified: Secondary | ICD-10-CM | POA: Diagnosis present

## 2020-10-23 DIAGNOSIS — L89154 Pressure ulcer of sacral region, stage 4: Secondary | ICD-10-CM | POA: Diagnosis present

## 2020-10-23 DIAGNOSIS — M069 Rheumatoid arthritis, unspecified: Secondary | ICD-10-CM | POA: Diagnosis present

## 2020-10-23 DIAGNOSIS — J80 Acute respiratory distress syndrome: Secondary | ICD-10-CM | POA: Diagnosis present

## 2020-10-23 DIAGNOSIS — J690 Pneumonitis due to inhalation of food and vomit: Secondary | ICD-10-CM | POA: Diagnosis present

## 2020-10-23 DIAGNOSIS — Z9911 Dependence on respirator [ventilator] status: Secondary | ICD-10-CM

## 2020-10-23 DIAGNOSIS — Z8659 Personal history of other mental and behavioral disorders: Secondary | ICD-10-CM

## 2020-10-23 DIAGNOSIS — J9601 Acute respiratory failure with hypoxia: Secondary | ICD-10-CM

## 2020-10-23 DIAGNOSIS — Z20822 Contact with and (suspected) exposure to covid-19: Secondary | ICD-10-CM | POA: Diagnosis present

## 2020-10-23 DIAGNOSIS — J9602 Acute respiratory failure with hypercapnia: Secondary | ICD-10-CM

## 2020-10-23 DIAGNOSIS — R6521 Severe sepsis with septic shock: Secondary | ICD-10-CM | POA: Diagnosis present

## 2020-10-23 DIAGNOSIS — L89214 Pressure ulcer of right hip, stage 4: Secondary | ICD-10-CM | POA: Diagnosis present

## 2020-10-23 DIAGNOSIS — E876 Hypokalemia: Secondary | ICD-10-CM | POA: Diagnosis present

## 2020-10-23 DIAGNOSIS — E46 Unspecified protein-calorie malnutrition: Secondary | ICD-10-CM | POA: Diagnosis present

## 2020-10-23 DIAGNOSIS — E872 Acidosis: Secondary | ICD-10-CM | POA: Diagnosis present

## 2020-10-23 DIAGNOSIS — G839 Paralytic syndrome, unspecified: Secondary | ICD-10-CM | POA: Diagnosis present

## 2020-10-23 DIAGNOSIS — L89224 Pressure ulcer of left hip, stage 4: Secondary | ICD-10-CM | POA: Diagnosis present

## 2020-10-23 DIAGNOSIS — K55059 Acute (reversible) ischemia of intestine, part and extent unspecified: Secondary | ICD-10-CM | POA: Diagnosis present

## 2020-10-23 DIAGNOSIS — K55069 Acute infarction of intestine, part and extent unspecified: Secondary | ICD-10-CM | POA: Diagnosis present

## 2020-10-23 DIAGNOSIS — M869 Osteomyelitis, unspecified: Secondary | ICD-10-CM | POA: Diagnosis present

## 2020-10-23 DIAGNOSIS — Z538 Procedure and treatment not carried out for other reasons: Secondary | ICD-10-CM | POA: Diagnosis not present

## 2020-10-23 DIAGNOSIS — Z79899 Other long term (current) drug therapy: Secondary | ICD-10-CM

## 2020-10-23 DIAGNOSIS — E871 Hypo-osmolality and hyponatremia: Secondary | ICD-10-CM | POA: Diagnosis present

## 2020-10-23 DIAGNOSIS — K659 Peritonitis, unspecified: Secondary | ICD-10-CM | POA: Diagnosis present

## 2020-10-23 DIAGNOSIS — Z681 Body mass index (BMI) 19 or less, adult: Secondary | ICD-10-CM

## 2020-10-23 DIAGNOSIS — I469 Cardiac arrest, cause unspecified: Secondary | ICD-10-CM | POA: Diagnosis present

## 2020-10-23 DIAGNOSIS — D6489 Other specified anemias: Secondary | ICD-10-CM | POA: Diagnosis present

## 2020-10-23 DIAGNOSIS — A419 Sepsis, unspecified organism: Principal | ICD-10-CM | POA: Diagnosis present

## 2020-10-23 DIAGNOSIS — Z9289 Personal history of other medical treatment: Secondary | ICD-10-CM

## 2020-10-23 LAB — I-STAT ARTERIAL BLOOD GAS, ED
Acid-base deficit: 9 mmol/L — ABNORMAL HIGH (ref 0.0–2.0)
Bicarbonate: 18.4 mmol/L — ABNORMAL LOW (ref 20.0–28.0)
Calcium, Ion: 1.02 mmol/L — ABNORMAL LOW (ref 1.15–1.40)
HCT: 19 % — ABNORMAL LOW (ref 36.0–46.0)
Hemoglobin: 6.5 g/dL — CL (ref 12.0–15.0)
O2 Saturation: 99 %
Patient temperature: 92.9
Potassium: <2 mmol/L — CL (ref 3.5–5.1)
Sodium: 137 mmol/L (ref 135–145)
TCO2: 20 mmol/L — ABNORMAL LOW (ref 22–32)
pCO2 arterial: 38.8 mmHg (ref 32.0–48.0)
pH, Arterial: 7.266 — ABNORMAL LOW (ref 7.350–7.450)
pO2, Arterial: 166 mmHg — ABNORMAL HIGH (ref 83.0–108.0)

## 2020-10-23 LAB — I-STAT CHEM 8, ED
BUN: <3 mg/dL — ABNORMAL LOW (ref 6–20)
Calcium, Ion: 1.01 mmol/L — ABNORMAL LOW (ref 1.15–1.40)
Chloride: 95 mmol/L — ABNORMAL LOW (ref 98–111)
Creatinine, Ser: <0.2 mg/dL — ABNORMAL LOW (ref 0.44–1.00)
Glucose, Bld: 151 mg/dL — ABNORMAL HIGH (ref 70–99)
HCT: 18 % — ABNORMAL LOW (ref 36.0–46.0)
Hemoglobin: 6.1 g/dL — CL (ref 12.0–15.0)
Potassium: 2.3 mmol/L — CL (ref 3.5–5.1)
Sodium: 135 mmol/L (ref 135–145)
TCO2: 22 mmol/L (ref 22–32)

## 2020-10-23 LAB — CBC WITH DIFFERENTIAL/PLATELET
Abs Immature Granulocytes: 0.8 10*3/uL — ABNORMAL HIGH (ref 0.00–0.07)
Basophils Absolute: 0.1 10*3/uL (ref 0.0–0.1)
Basophils Relative: 1 %
Eosinophils Absolute: 0 10*3/uL (ref 0.0–0.5)
Eosinophils Relative: 0 %
HCT: 32.1 % — ABNORMAL LOW (ref 36.0–46.0)
Hemoglobin: 10.2 g/dL — ABNORMAL LOW (ref 12.0–15.0)
Immature Granulocytes: 4 %
Lymphocytes Relative: 21 %
Lymphs Abs: 4.2 10*3/uL — ABNORMAL HIGH (ref 0.7–4.0)
MCH: 31.9 pg (ref 26.0–34.0)
MCHC: 31.8 g/dL (ref 30.0–36.0)
MCV: 100.3 fL — ABNORMAL HIGH (ref 80.0–100.0)
Monocytes Absolute: 0.5 10*3/uL (ref 0.1–1.0)
Monocytes Relative: 3 %
Neutro Abs: 13.9 10*3/uL — ABNORMAL HIGH (ref 1.7–7.7)
Neutrophils Relative %: 71 %
Platelets: 423 10*3/uL — ABNORMAL HIGH (ref 150–400)
RBC: 3.2 MIL/uL — ABNORMAL LOW (ref 3.87–5.11)
RDW: 14.9 % (ref 11.5–15.5)
WBC: 19.5 10*3/uL — ABNORMAL HIGH (ref 4.0–10.5)
nRBC: 0.7 % — ABNORMAL HIGH (ref 0.0–0.2)

## 2020-10-23 LAB — URINALYSIS, MICROSCOPIC (REFLEX)

## 2020-10-23 LAB — POCT I-STAT 7, (LYTES, BLD GAS, ICA,H+H)
Acid-base deficit: 13 mmol/L — ABNORMAL HIGH (ref 0.0–2.0)
Bicarbonate: 17.1 mmol/L — ABNORMAL LOW (ref 20.0–28.0)
Calcium, Ion: 1.04 mmol/L — ABNORMAL LOW (ref 1.15–1.40)
HCT: 28 % — ABNORMAL LOW (ref 36.0–46.0)
Hemoglobin: 9.5 g/dL — ABNORMAL LOW (ref 12.0–15.0)
O2 Saturation: 68 %
Patient temperature: 34.7
Potassium: 2.1 mmol/L — CL (ref 3.5–5.1)
Sodium: 136 mmol/L (ref 135–145)
TCO2: 19 mmol/L — ABNORMAL LOW (ref 22–32)
pCO2 arterial: 56.8 mmHg — ABNORMAL HIGH (ref 32.0–48.0)
pH, Arterial: 7.071 — CL (ref 7.350–7.450)
pO2, Arterial: 44 mmHg — ABNORMAL LOW (ref 83.0–108.0)

## 2020-10-23 LAB — BASIC METABOLIC PANEL
Anion gap: 12 (ref 5–15)
Anion gap: 12 (ref 5–15)
BUN: <5 mg/dL — ABNORMAL LOW (ref 6–20)
BUN: <5 mg/dL — ABNORMAL LOW (ref 6–20)
CO2: 21 mmol/L — ABNORMAL LOW (ref 22–32)
CO2: 18 mmol/L — ABNORMAL LOW (ref 22–32)
Calcium: 5.9 mg/dL — CL (ref 8.9–10.3)
Calcium: 5.3 mg/dL — CL (ref 8.9–10.3)
Chloride: 99 mmol/L (ref 98–111)
Chloride: 105 mmol/L (ref 98–111)
Creatinine, Ser: 0.46 mg/dL (ref 0.44–1.00)
Creatinine, Ser: 0.61 mg/dL (ref 0.44–1.00)
GFR, Estimated: >60 mL/min (ref >60)
GFR, Estimated: >60 mL/min (ref >60)
Glucose, Bld: 386 mg/dL — ABNORMAL HIGH (ref 70–99)
Glucose, Bld: 227 mg/dL — ABNORMAL HIGH (ref 70–99)
Potassium: <2 mmol/L — CL (ref 3.5–5.1)
Potassium: 2.8 mmol/L — ABNORMAL LOW (ref 3.5–5.1)
Sodium: 132 mmol/L — ABNORMAL LOW (ref 135–145)
Sodium: 135 mmol/L (ref 135–145)

## 2020-10-23 LAB — COMPREHENSIVE METABOLIC PANEL
ALT: 22 U/L (ref 0–44)
AST: 119 U/L — ABNORMAL HIGH (ref 15–41)
Albumin: 1.4 g/dL — ABNORMAL LOW (ref 3.5–5.0)
Alkaline Phosphatase: 100 U/L (ref 38–126)
Anion gap: 20 — ABNORMAL HIGH (ref 5–15)
BUN: <5 mg/dL — ABNORMAL LOW (ref 6–20)
CO2: 22 mmol/L (ref 22–32)
Calcium: 8.2 mg/dL — ABNORMAL LOW (ref 8.9–10.3)
Chloride: 88 mmol/L — ABNORMAL LOW (ref 98–111)
Creatinine, Ser: 0.6 mg/dL (ref 0.44–1.00)
GFR, Estimated: >60 mL/min (ref >60)
Glucose, Bld: 250 mg/dL — ABNORMAL HIGH (ref 70–99)
Potassium: 3.5 mmol/L (ref 3.5–5.1)
Sodium: 130 mmol/L — ABNORMAL LOW (ref 135–145)
Total Bilirubin: 0.7 mg/dL (ref 0.3–1.2)
Total Protein: 5.4 g/dL — ABNORMAL LOW (ref 6.5–8.1)

## 2020-10-23 LAB — RESP PANEL BY RT-PCR (FLU A&B, COVID) ARPGX2
Influenza A by PCR: NEGATIVE
Influenza B by PCR: NEGATIVE
SARS Coronavirus 2 by RT PCR: NEGATIVE

## 2020-10-23 LAB — URINALYSIS, ROUTINE W REFLEX MICROSCOPIC
Bilirubin Urine: NEGATIVE
Glucose, UA: 100 mg/dL — AB
Ketones, ur: NEGATIVE mg/dL
Leukocytes,Ua: NEGATIVE
Nitrite: NEGATIVE
Protein, ur: 100 mg/dL — AB
Specific Gravity, Urine: >1.03 — ABNORMAL HIGH (ref 1.005–1.030)
pH: 5.5 (ref 5.0–8.0)

## 2020-10-23 LAB — POC OCCULT BLOOD, ED: Fecal Occult Bld: NEGATIVE

## 2020-10-23 LAB — HEMOGLOBIN A1C
Hgb A1c MFr Bld: 5 % (ref 4.8–5.6)
Mean Plasma Glucose: 96.8 mg/dL

## 2020-10-23 LAB — PROTIME-INR
INR: 1.1 (ref 0.8–1.2)
Prothrombin Time: 14.6 seconds (ref 11.4–15.2)

## 2020-10-23 LAB — CK: Total CK: 44 U/L (ref 38–234)

## 2020-10-23 LAB — TROPONIN I (HIGH SENSITIVITY)
Troponin I (High Sensitivity): 12 ng/L (ref <18)
Troponin I (High Sensitivity): 24 ng/L — ABNORMAL HIGH (ref <18)

## 2020-10-23 LAB — MAGNESIUM: Magnesium: 1.9 mg/dL (ref 1.7–2.4)

## 2020-10-23 LAB — GLUCOSE, CAPILLARY
Glucose-Capillary: 180 mg/dL — ABNORMAL HIGH (ref 70–99)
Glucose-Capillary: 330 mg/dL — ABNORMAL HIGH (ref 70–99)
Glucose-Capillary: 199 mg/dL — ABNORMAL HIGH (ref 70–99)

## 2020-10-23 LAB — LACTIC ACID, PLASMA
Lactic Acid, Venous: >11 mmol/L (ref 0.5–1.9)
Lactic Acid, Venous: 9.3 mmol/L (ref 0.5–1.9)

## 2020-10-23 LAB — I-STAT BETA HCG BLOOD, ED (MC, WL, AP ONLY): I-stat hCG, quantitative: <5 m[IU]/mL (ref <5)

## 2020-10-23 LAB — MRSA PCR SCREENING: MRSA by PCR: NEGATIVE

## 2020-10-23 LAB — APTT: aPTT: 30 seconds (ref 24–36)

## 2020-10-23 MED ORDER — SODIUM CHLORIDE 0.9 % IV SOLN
2.0000 g | Freq: Once | INTRAVENOUS | Status: AC
  Administered 2020-10-23: 2 g via INTRAVENOUS
  Filled 2020-10-23: qty 2

## 2020-10-23 MED ORDER — SODIUM CHLORIDE 0.9 % IV SOLN: INTRAVENOUS | Status: DC

## 2020-10-23 MED ORDER — SODIUM CHLORIDE 0.9 % IV BOLUS
1000.0000 mL | Freq: Once | INTRAVENOUS | Status: AC
  Administered 2020-10-23: 1000 mL via INTRAVENOUS

## 2020-10-23 MED ORDER — EPINEPHRINE HCL 5 MG/250ML IV SOLN IN NS
0.5000 ug/min | INTRAVENOUS | Status: DC
  Administered 2020-10-23: 20 ug/min via INTRAVENOUS
  Administered 2020-10-23: 2 ug/min via INTRAVENOUS
  Filled 2020-10-23: qty 250

## 2020-10-23 MED ORDER — STERILE WATER FOR INJECTION IV SOLN
INTRAVENOUS | Status: DC
  Filled 2020-10-23: qty 1000
  Filled 2020-10-23: qty 150

## 2020-10-23 MED ORDER — PROPOFOL 1000 MG/100ML IV EMUL: 0.0000 ug/kg/min | INTRAVENOUS | Status: DC

## 2020-10-23 MED ORDER — ETOMIDATE 2 MG/ML IV SOLN
INTRAVENOUS | Status: AC | PRN
  Administered 2020-10-23: 20 mg via INTRAVENOUS

## 2020-10-23 MED ORDER — NOREPINEPHRINE 16 MG/250ML-% IV SOLN
0.0000 ug/min | INTRAVENOUS | Status: DC
  Administered 2020-10-23: 40 ug/min via INTRAVENOUS
  Filled 2020-10-23: qty 250

## 2020-10-23 MED ORDER — POTASSIUM CHLORIDE 10 MEQ/50ML IV SOLN: 10.0000 meq | INTRAVENOUS | Status: DC

## 2020-10-23 MED ORDER — MUPIROCIN 2 % EX OINT
1.0000 "application " | TOPICAL_OINTMENT | Freq: Two times a day (BID) | CUTANEOUS | Status: DC
  Administered 2020-10-23: 1 via NASAL
  Filled 2020-10-23: qty 22

## 2020-10-23 MED ORDER — SODIUM CHLORIDE 0.9 % IV SOLN
1.2500 ng/kg/min | INTRAVENOUS | Status: DC
  Filled 2020-10-23: qty 1

## 2020-10-23 MED ORDER — SODIUM BICARBONATE 8.4 % IV SOLN
100.0000 meq | Freq: Once | INTRAVENOUS | Status: AC
  Administered 2020-10-23: 100 meq via INTRAVENOUS

## 2020-10-23 MED ORDER — SODIUM CHLORIDE 0.9 % IV BOLUS (SEPSIS)
1000.0000 mL | Freq: Once | INTRAVENOUS | Status: AC
  Administered 2020-10-23: 1000 mL via INTRAVENOUS

## 2020-10-23 MED ORDER — IOHEXOL 350 MG/ML SOLN
100.0000 mL | Freq: Once | INTRAVENOUS | Status: AC | PRN
  Administered 2020-10-23: 100 mL via INTRAVENOUS

## 2020-10-23 MED ORDER — POTASSIUM CHLORIDE 10 MEQ/50ML IV SOLN
10.0000 meq | INTRAVENOUS | Status: AC
  Administered 2020-10-23 (×2): 10 meq via INTRAVENOUS

## 2020-10-23 MED ORDER — HEPARIN SODIUM (PORCINE) 5000 UNIT/ML IJ SOLN
60.0000 [IU]/kg | Freq: Once | INTRAMUSCULAR | Status: AC
  Administered 2020-10-23: 3000 [IU] via INTRAVENOUS
  Filled 2020-10-23: qty 1

## 2020-10-23 MED ORDER — CHLORHEXIDINE GLUCONATE CLOTH 2 % EX PADS
6.0000 | MEDICATED_PAD | Freq: Every day | CUTANEOUS | Status: DC
  Administered 2020-10-23: 6 via TOPICAL

## 2020-10-23 MED ORDER — VANCOMYCIN HCL IN DEXTROSE 1-5 GM/200ML-% IV SOLN
1000.0000 mg | Freq: Once | INTRAVENOUS | Status: AC
  Administered 2020-10-23: 1000 mg via INTRAVENOUS
  Filled 2020-10-23: qty 200

## 2020-10-23 MED ORDER — SODIUM CHLORIDE 0.9 % IV SOLN
2.0000 g | Freq: Three times a day (TID) | INTRAVENOUS | Status: DC
  Administered 2020-10-23: 2 g via INTRAVENOUS
  Filled 2020-10-23: qty 2

## 2020-10-23 MED ORDER — SODIUM BICARBONATE 8.4 % IV SOLN
INTRAVENOUS | Status: AC
  Filled 2020-10-23: qty 100

## 2020-10-23 MED ORDER — DOCUSATE SODIUM 100 MG PO CAPS: 100.0000 mg | ORAL_CAPSULE | Freq: Two times a day (BID) | ORAL | Status: DC | PRN

## 2020-10-23 MED ORDER — POLYETHYLENE GLYCOL 3350 17 G PO PACK: 17.0000 g | PACK | Freq: Every day | ORAL | Status: DC | PRN

## 2020-10-23 MED ORDER — VASOPRESSIN 20 UNITS/100 ML INFUSION FOR SHOCK
0.0000 [IU]/min | INTRAVENOUS | Status: DC
  Administered 2020-10-23: 0.04 [IU]/min via INTRAVENOUS
  Filled 2020-10-23: qty 100

## 2020-10-23 MED ORDER — METRONIDAZOLE 500 MG/100ML IV SOLN
500.0000 mg | Freq: Once | INTRAVENOUS | Status: AC
  Administered 2020-10-23: 500 mg via INTRAVENOUS
  Filled 2020-10-23: qty 100

## 2020-10-23 MED ORDER — INSULIN ASPART 100 UNIT/ML IJ SOLN
0.0000 [IU] | INTRAMUSCULAR | Status: DC
  Administered 2020-10-23: 7 [IU] via SUBCUTANEOUS
  Administered 2020-10-23: 2 [IU] via SUBCUTANEOUS

## 2020-10-23 MED ORDER — METRONIDAZOLE 500 MG/100ML IV SOLN
500.0000 mg | Freq: Three times a day (TID) | INTRAVENOUS | Status: DC
  Administered 2020-10-23: 500 mg via INTRAVENOUS
  Filled 2020-10-23: qty 100

## 2020-10-23 MED ORDER — PROPOFOL 1000 MG/100ML IV EMUL
INTRAVENOUS | Status: AC
  Filled 2020-10-23: qty 100

## 2020-10-23 MED ORDER — EPINEPHRINE HCL 5 MG/250ML IV SOLN IN NS
INTRAVENOUS | Status: AC
  Filled 2020-10-23: qty 250

## 2020-10-23 MED ORDER — POTASSIUM CHLORIDE 10 MEQ/100ML IV SOLN: 10.0000 meq | INTRAVENOUS | Status: DC

## 2020-10-23 MED ORDER — PANTOPRAZOLE SODIUM 40 MG IV SOLR
40.0000 mg | Freq: Once | INTRAVENOUS | Status: AC
  Administered 2020-10-23: 40 mg via INTRAVENOUS
  Filled 2020-10-23: qty 40

## 2020-10-23 MED ORDER — ROCURONIUM BROMIDE 50 MG/5ML IV SOLN
INTRAVENOUS | Status: AC | PRN
  Administered 2020-10-23: 100 mg via INTRAVENOUS

## 2020-10-23 MED ORDER — POTASSIUM CHLORIDE 10 MEQ/50ML IV SOLN
10.0000 meq | INTRAVENOUS | Status: DC
  Administered 2020-10-23 (×2): 10 meq via INTRAVENOUS
  Filled 2020-10-23 (×4): qty 50

## 2020-10-23 MED ORDER — FENTANYL CITRATE (PF) 100 MCG/2ML IJ SOLN: 100.0000 ug | INTRAMUSCULAR | Status: DC | PRN

## 2020-10-23 MED ORDER — CALCIUM GLUCONATE-NACL 2-0.675 GM/100ML-% IV SOLN
2.0000 g | Freq: Once | INTRAVENOUS | Status: AC
  Administered 2020-10-23: 2000 mg via INTRAVENOUS
  Filled 2020-10-23: qty 100

## 2020-10-23 MED ORDER — NOREPINEPHRINE 4 MG/250ML-% IV SOLN
0.0000 ug/min | INTRAVENOUS | Status: DC
  Administered 2020-10-23: 2 ug/min via INTRAVENOUS
  Administered 2020-10-23: 40 ug/min via INTRAVENOUS
  Filled 2020-10-23: qty 250
  Filled 2020-10-23: qty 500

## 2020-10-23 MED ORDER — SODIUM CHLORIDE 0.9 % IV SOLN
1000.0000 mL | INTRAVENOUS | Status: DC
  Administered 2020-10-23: 1000 mL via INTRAVENOUS

## 2020-10-23 MED ORDER — FENTANYL CITRATE (PF) 100 MCG/2ML IJ SOLN
100.0000 ug | INTRAMUSCULAR | Status: DC | PRN
Start: 2020-10-23 — End: 2020-10-23

## 2020-10-23 MED ORDER — VANCOMYCIN HCL IN DEXTROSE 1-5 GM/200ML-% IV SOLN
1000.0000 mg | Freq: Two times a day (BID) | INTRAVENOUS | Status: DC
  Filled 2020-10-23: qty 200

## 2020-10-24 LAB — BLOOD CULTURE ID PANEL (REFLEXED) - BCID2

## 2020-10-25 LAB — CULTURE, BLOOD (ROUTINE X 2): Special Requests: ADEQUATE

## 2020-10-28 LAB — CULTURE, BLOOD (ROUTINE X 2)
Culture: NO GROWTH
Special Requests: ADEQUATE

## 2020-11-22 NOTE — ED Notes (Signed)
Christin Fudge RN took critical lab value greater than 11

## 2020-11-22 NOTE — Progress Notes (Signed)
Pharmacy Antibiotic Note  Gabrielle Singh is a 35 y.o. female admitted on 11-18-2020 with sepsis.  Pharmacy has been consulted for vancomycin and cefepime dosing. Vancomycin 1gm and cefepime 2gm given in ED  Plan: Cont vancomycin 1gm IV q12 Cont cefepime 2gm IV q8 hours F/u renal function, cultures and clinical course  Height: 5\' 4"  (162.6 cm) Weight: 49.9 kg (110 lb) (from Oct 2021 records) IBW/kg (Calculated) : 54.7  Temp (24hrs), Avg:94.2 F (34.6 C), Min:92.9 F (33.8 C), Max:95.8 F (35.4 C)  Recent Labs  Lab 11/18/20 0322 11-18-20 0325 2020/11/18 0411  WBC 19.5*  --   --   CREATININE 0.60  --  <0.20*  LATICACIDVEN  --  >11.0*  --     CrCl cannot be calculated (This lab value cannot be used to calculate CrCl because it is not a number: <0.20).    No Known Allergies   Thank you for allowing pharmacy to be a part of this patient's care.  Glenette Bookwalter, [place] 11/18/20 5:06 AM

## 2020-11-22 NOTE — ED Provider Notes (Signed)
[facility] Provider Note  CSN: [id number] Arrival date & time: 11/21/20 0309  Chief Complaint(s) Cardiac Arrest  ED Triage Notes [name], RN (Registered Nurse) . [name] . [name] Date of Service: 2020-11-21 4:03 AM . . Addendum   Pt bib EMS from home where pt was found unresponsive by husband. 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 Pt was at her normal state at around 9pm; pt was then found to be unresponsive at 1:15 am. Pt was breathing and had pulses when EMS got to pt, however, pt's radial pulses were absent. Pt became apneic on the way to hospital and was bagged. Pt lost pulses, was in PEA, and CPR initiated for 6 minutes. 1 epi given PTA and ROSC was obtained with no defibrillation. King airway placed PTA.      HPI Gabrielle Singh is a 35 y.o. female here after cardiac arrest. EMS called out for unresponsiveness.  Upon EMS arrival patient had a intact carotid pulse.  In route patient became apneic and went into PEA arrest.  ACLS initiated and patient responded after 1 round of epi.  Per EMS husband reported that the patient has had decreased oral intake and is has been dealing with malnutrition.  Remainder of history, ROS, and physical exam limited due to patient's condition (unreponsive). Additional information was obtained from EMS.   Level V Caveat.  Attempt to call husband unsuccessful.  HPI  Past Medical History History reviewed. No pertinent past medical history. Patient Active Problem List   Diagnosis Date Noted  . Sepsis ([place]) 2020/11/21  . Debility 04/21/2020  . Malnutrition of moderate degree 04/20/2020  . Abnormal CT scan, gastrointestinal tract   . Left arm weakness 04/17/2020  . Iron deficiency anemia 04/17/2020  . Acute on chronic blood loss anemia 04/17/2020  . Hypoglycemia 04/17/2020  . Pressure injury of skin 04/16/2020  . Abdominal pain 04/15/2020   Home Medication(s) Prior to Admission medications   Medication  Sig Start Date End Date Taking? Authorizing Provider  feeding supplement (ENSURE ENLIVE / ENSURE PLUS) LIQD Take 237 mLs by mouth 3 (three) times daily between meals. 04/21/20   Virl Axe, MD  folic acid (FOLVITE) 1 MG tablet Take 1 tablet (1 mg total) by mouth daily. 04/22/20   Virl Axe, MD  Multiple Vitamin (MULTIVITAMIN WITH MINERALS) TABS tablet Take 1 tablet by mouth daily. 04/22/20   Virl Axe, MD  Nutritional Supplements (,FEEDING SUPPLEMENT, PROSOURCE PLUS) liquid Take 30 mLs by mouth 2 (two) times daily between meals. 04/21/20   Virl Axe, MD  pantoprazole (PROTONIX) 40 MG tablet Take 1 tablet (40 mg total) by mouth 2 (two) times daily. 04/21/20   Virl Axe, MD  polyethylene glycol (MIRALAX / GLYCOLAX) 17 g packet Take 17 g by mouth at bedtime. 04/21/20   Virl Axe, MD  thiamine 100 MG tablet Take 1 tablet (100 mg total) by mouth daily. 04/22/20   Virl Axe, MD  Past Surgical History History reviewed. No pertinent surgical history. Family History History reviewed. No pertinent family history.  Social History Social History   Tobacco Use  . Smoking status: Never Smoker  . Smokeless tobacco: Never Used  Substance Use Topics  . Alcohol use: Not Currently  . Drug use: Not Currently   Allergies Patient has no known allergies.  Review of Systems Review of Systems  Unable to perform ROS: Patient unresponsive    Physical Exam Vital Signs  I have reviewed the triage vital signs BP (!) 87/59   Pulse 96   Temp (!) 93.3 F (34.1 C)   Resp (!) 28   Ht 5\' 4"  (1.626 m)   Wt 49.9 kg Comment: from Oct 2021 records  LMP  (LMP Unknown)   SpO2 90%   BMI 18.88 kg/m   Physical Exam Vitals reviewed.  Constitutional:      General: She is in acute distress.     Appearance: She is well-developed. She is toxic-appearing.      Comments: King airway in place  HENT:     Head: Normocephalic and atraumatic.     Nose: Nose normal.  Eyes:     General: No scleral icterus.       Right eye: No discharge.        Left eye: No discharge.     Conjunctiva/sclera: Conjunctivae normal.     Pupils: Pupils are equal, round, and reactive to light.  Cardiovascular:     Rate and Rhythm: Regular rhythm. Tachycardia present.     Heart sounds: No murmur heard. No friction rub. No gallop.   Pulmonary:     Effort: Bradypnea and respiratory distress present.  Abdominal:     General: There is no distension.     Palpations: Abdomen is soft.     Tenderness: There is no abdominal tenderness.  Genitourinary:   Musculoskeletal:        General: No tenderness.     Cervical back: Normal range of motion and neck supple.  Skin:    General: Skin is warm and dry.     Findings: No erythema or rash.  Neurological:     Mental Status: She is unresponsive.     Motor: Atrophy present.     ED Results and Treatments Labs (all labs ordered are listed, but only abnormal results are displayed) Labs Reviewed  CBC WITH DIFFERENTIAL/PLATELET - Abnormal; Notable for the following components:      Result Value   WBC 19.5 (*)    RBC 3.20 (*)    Hemoglobin 10.2 (*)    HCT 32.1 (*)    MCV 100.3 (*)    Platelets 423 (*)    nRBC 0.7 (*)    Neutro Abs 13.9 (*)    Lymphs Abs 4.2 (*)    Abs Immature Granulocytes 0.80 (*)    All other components within normal limits  COMPREHENSIVE METABOLIC PANEL - Abnormal; Notable for the following components:   Sodium 130 (*)    Chloride 88 (*)    Glucose, Bld 250 (*)    BUN <5 (*)    Calcium 8.2 (*)    Total Protein 5.4 (*)    Albumin 1.4 (*)    AST 119 (*)    Anion gap 20 (*)    All other components within normal limits  LACTIC ACID, PLASMA - Abnormal; Notable for the following components:   Lactic Acid, Venous >11.0 (*)    All other components within normal limits  URINALYSIS, ROUTINE W REFLEX  MICROSCOPIC - Abnormal; Notable for the following components:   Specific Gravity, Urine >1.030 (*)    Glucose, UA 100 (*)    Hgb urine dipstick SMALL (*)    Protein, ur 100 (*)    All other components within normal limits  URINALYSIS, MICROSCOPIC (REFLEX) - Abnormal; Notable for the following components:   Bacteria, UA RARE (*)    All other components within normal limits  LACTIC ACID, PLASMA - Abnormal; Notable for the following components:   Lactic Acid, Venous 9.3 (*)    All other components within normal limits  I-STAT CHEM 8, ED - Abnormal; Notable for the following components:   Potassium 2.3 (*)    Chloride 95 (*)    BUN <3 (*)    Creatinine, Ser <0.20 (*)    Glucose, Bld 151 (*)    Calcium, Ion 1.01 (*)    Hemoglobin 6.1 (*)    HCT 18.0 (*)    All other components within normal limits  I-STAT ARTERIAL BLOOD GAS, ED - Abnormal; Notable for the following components:   pH, Arterial 7.266 (*)    pO2, Arterial 166 (*)    Bicarbonate 18.4 (*)    TCO2 20 (*)    Acid-base deficit 9.0 (*)    Potassium <2.0 (*)    Calcium, Ion 1.02 (*)    HCT 19.0 (*)    Hemoglobin 6.5 (*)    All other components within normal limits  RESP PANEL BY RT-PCR (FLU A&B, COVID) ARPGX2  CULTURE, BLOOD (ROUTINE X 2)  CULTURE, BLOOD (ROUTINE X 2)  PROTIME-INR  APTT  CK  MAGNESIUM  BLOOD GAS, ARTERIAL  BLOOD GAS, ARTERIAL  I-STAT BETA HCG BLOOD, ED ([facility], [facility], [facility] ONLY)  POC OCCULT BLOOD, ED  TROPONIN I (HIGH SENSITIVITY)  TROPONIN I (HIGH SENSITIVITY)                                                                                                                         EKG  EKG Interpretation  Date/Time:  11-11-2020 03:10:59 EDT Ventricular Rate:  129 PR Interval:  120 QRS Duration: 72 QT Interval:  302 QTC Calculation: 443 R Axis:   63 Text Interpretation: Sinus tachycardia LAE, consider biatrial enlargement Abnormal R-wave progression, early transition Confirmed by Addison Lank  [phone]) on 11-11-20 3:57:16 AM      Radiology CT Head Wo Contrast  Result Date: 11-Nov-2020 CLINICAL DATA:  Mental status change, unknown cause EXAM: CT HEAD WITHOUT CONTRAST TECHNIQUE: Contiguous axial images were obtained from the base of the skull through the vertex without intravenous contrast. COMPARISON:  CT head 04/15/2020, MR 04/17/2020 FINDINGS: Brain: No evidence of acute infarction, hemorrhage, hydrocephalus, extra-axial collection, visible mass lesion or mass effect. Vascular: No hyperdense vessel or unexpected calcification. Skull: Right frontal and periorbital soft tissue swelling without calvarial fracture or visible facial bone fracture within the margins of imaging. Sinuses/Orbits: Right periorbital soft tissue swelling. No retro  septal gas, stranding or hemorrhage. Globes appear normal and symmetric. Thickening in the nasal passages with some pneumatized secretions and layering air-fluid levels in the maxillary and sphenoid sinuses likely related to instrumentation/intubation seen on contemporary CT of the chest and abdomen. Other: Mastoid air cells and middle ear cavities are clear. IMPRESSION: No acute intracranial abnormality. Right periorbital and frontal soft tissue swelling without calvarial or facial bone fracture within the margins of imaging. No globe or retro septal injury is identified. Pneumatized secretions and air-fluid levels in the paranasal sinuses with thickening of the nasal passages likely related to intubation. Electronically Signed   By: Lovena Le M.D.   On: Nov 20, 2020 05:44   CT Angio Chest PE W and/or Wo Contrast  Result Date: 2020-11-20 CLINICAL DATA:  Altered mental status, found unresponsive, known sickle ulcerations. EXAM: CT ANGIOGRAPHY CHEST CT ABDOMEN AND PELVIS WITH CONTRAST TECHNIQUE: Multidetector CT imaging of the chest was performed using the standard protocol during bolus administration of intravenous contrast. Multiplanar CT image reconstructions and  MIPs were obtained to evaluate the vascular anatomy. Multidetector CT imaging of the abdomen and pelvis was performed using the standard protocol during bolus administration of intravenous contrast. CONTRAST:  151mL OMNIPAQUE IOHEXOL 350 MG/ML SOLN COMPARISON:  CT 04/15/2020 FINDINGS: CTA CHEST FINDINGS Cardiovascular: Satisfactory opacification the pulmonary arteries to the segmental level. No pulmonary artery filling defects are identified. Central pulmonary arteries are normal caliber. Normal heart size. No pericardial effusion. The aortic root is suboptimally assessed given cardiac pulsation artifact. The aorta is normal caliber. Left vertebral artery arises directly from the aortic arch between the left common carotid and subclavian artery origins. No acute abnormality of proximal great vessels. No major venous abnormality. No significant venous reflux. Mediastinum/Nodes: Endotracheal tube tip terminates in the low trachea, approximately 1 cm from the carina (9/57). Scattered secretions are seen below the level of the endotracheal balloon. Transesophageal tube tip and side port are distal to the GE junction. No worrisome thyroid nodules or masses. No conspicuous mediastinal, hilar or axillary adenopathy. Lungs/Pleura: Extensive areas of airless lung present throughout both lower lobes widespread multifocal patchy centrilobular ground-glass opacities and more diffuse consolidative densities elsewhere throughout both lungs. Some mild septal thickening is present. Scattered fluid-filled and thickened airways are noted. No sizable pneumothorax or visible effusion. Musculoskeletal: Remote appearing nonunited live sixth and seventh lateral rib fractures. Additional remote right anterior fourth rib fracture as well. No visible acute displaced rib fractures. No sternal fractures are identified. Costal cartilages appear intact. No acute osseous abnormality of the included shoulders. No acute fracture or traumatic  listhesis of the thoracic spine. Mild dextrocurvature centered at T8-T9. Some edematous changes are noted subjacent to the pacer pads along the posterior left chest wall, nonspecific given recent resuscitative efforts. Additional pacer pad anteriorly. Review of the MIP images confirms the above findings. CT ABDOMEN and PELVIS FINDINGS Hepatobiliary: Diffuse hepatic hypoattenuation compatible with hepatic steatosis. Sparing along the gallbladder fossa. No concerning focal liver lesion. Smooth liver surface contour. Normal gallbladder Pancreas: No pancreatic ductal dilatation or surrounding inflammatory changes. Spleen: Normal splenic size. Some wedge-shaped hypoattenuation is seen in the periphery of the inferior spleen best noted on coronal imaging 16/45) possibly related to artifact though a small splenic infarct could have this appearance. Adrenals/Urinary Tract: No concerning adrenal lesion. Kidneys enhance symmetrically and uniformly. Symmetric renal excretion. No worrisome renal lesion. No urolithiasis or hydronephrosis. Urinary bladder is partially decompressed by inflated Foley catheter. Some excess catheter tubing is seen coiled within the  urinary bladder Stomach/Bowel: Transesophageal tube tip and side port terminate within the gastric lumen. Diffuse edematous appearance of the gastric body with prominent rugal folds and mucosal hyperemia as well as extensive edematous mural changes of the duodenum, jejunum and portions of the ileum and portions of the proximal colon concerning for CT hypoperfusion complex/shock bowel in the setting of recent resuscitative efforts. No obstructive process is seen. Normal appendix in the right lower quadrant. Vascular/Lymphatic: Focal region of mid mesenteric hazy stranding with numerous reactive appearing clustered mid mesenteric lymph nodes compatible with mesenteritis (13/48). No other conspicuous or enlarged lymph nodes are seen in the abdomen or pelvis. No acute vascular  abnormality. Portal and hepatic veins are widely patent. No major venous occlusions. Reproductive: Normal appearance of the uterus and adnexal structures. Other: No abdominopelvic free fluid or free gas. No bowel containing hernias. Focal skin thickening superficial to the sacrum as well as thickening along the subcutaneous fat plane. Additional focal skin thickening and soft tissue defect with gas and a rim enhancing air and fluid containing collection seen abutting the right ischial tuberosity. Musculoskeletal: Minimal cortical erosive changes of the right ischial tuberosity subjacent to the cutaneous/soft tissue defect. Additional minimal sclerotic changes and bony remodeling noted minimally at the. The osseous structures appear diffusely demineralized which may limit detection of small or nondisplaced fractures. Sacrum deep to overlying decubitus ulceration Review of the MIP images confirms the above findings. IMPRESSION: 1. Satisfactory opacification of pulmonary arteries. No evidence of pulmonary artery embolism. 2. Extensive regions of airless lung in both lower lobes likely reflecting some extensive atelectatic change. Additional centrilobular ground-glass and patchy consolidation in the aerated portions of the lungs may reflect some superimposed infectious process, sequela of aspiration and/or developing pulmonary edema in the setting of resuscitation. 3. Low positioning of the endotracheal tube 1 cm from the carina. Consider retraction 2-3 cm to position in the mid trachea. Secretions noted below the level of the endotracheal balloon. 4. Remote, nonunited left sixth and seventh lateral rib fractures. Remote right anterior fourth rib fracture. No acute rib or sternal fractures are seen. 5. Subcutaneous edematous changes subjacent to the posterior pacer pad. Correlate with visual inspection 6. Wedge-shaped hypoattenuation in the posteroinferior spleen may reflect a small splenic infarct. 7. Diffuse  thickening of large and proximal small bowel loops with edematous mural changes and mucosal hyperemia suggestive of CT hypoperfusion complex/shock bowel. 8. Foley catheter in place. Excessive catheter tubing is noted within the bladder lumen. 9. Some mild sacral decubitus ulceration with minimal sclerotic changes of the adjacent sacral bone possibly reflecting some sequela of prior osteomyelitis. 10. More extensive decubitus ulceration and and cutaneous defect with minimal transcortical lucency of the right ischial tuberosity which could reflect early osteomyelitis. Electronically Signed   By: Lovena Le M.D.   On: 10/27/20 06:04   CT ABDOMEN PELVIS W CONTRAST  Result Date: 10-27-20 CLINICAL DATA:  Altered mental status, found unresponsive, known sickle ulcerations. EXAM: CT ANGIOGRAPHY CHEST CT ABDOMEN AND PELVIS WITH CONTRAST TECHNIQUE: Multidetector CT imaging of the chest was performed using the standard protocol during bolus administration of intravenous contrast. Multiplanar CT image reconstructions and MIPs were obtained to evaluate the vascular anatomy. Multidetector CT imaging of the abdomen and pelvis was performed using the standard protocol during bolus administration of intravenous contrast. CONTRAST:  18mL OMNIPAQUE IOHEXOL 350 MG/ML SOLN COMPARISON:  CT 04/15/2020 FINDINGS: CTA CHEST FINDINGS Cardiovascular: Satisfactory opacification the pulmonary arteries to the segmental level. No pulmonary artery filling defects are  identified. Central pulmonary arteries are normal caliber. Normal heart size. No pericardial effusion. The aortic root is suboptimally assessed given cardiac pulsation artifact. The aorta is normal caliber. Left vertebral artery arises directly from the aortic arch between the left common carotid and subclavian artery origins. No acute abnormality of proximal great vessels. No major venous abnormality. No significant venous reflux. Mediastinum/Nodes: Endotracheal tube tip  terminates in the low trachea, approximately 1 cm from the carina (9/57). Scattered secretions are seen below the level of the endotracheal balloon. Transesophageal tube tip and side port are distal to the GE junction. No worrisome thyroid nodules or masses. No conspicuous mediastinal, hilar or axillary adenopathy. Lungs/Pleura: Extensive areas of airless lung present throughout both lower lobes widespread multifocal patchy centrilobular ground-glass opacities and more diffuse consolidative densities elsewhere throughout both lungs. Some mild septal thickening is present. Scattered fluid-filled and thickened airways are noted. No sizable pneumothorax or visible effusion. Musculoskeletal: Remote appearing nonunited live sixth and seventh lateral rib fractures. Additional remote right anterior fourth rib fracture as well. No visible acute displaced rib fractures. No sternal fractures are identified. Costal cartilages appear intact. No acute osseous abnormality of the included shoulders. No acute fracture or traumatic listhesis of the thoracic spine. Mild dextrocurvature centered at T8-T9. Some edematous changes are noted subjacent to the pacer pads along the posterior left chest wall, nonspecific given recent resuscitative efforts. Additional pacer pad anteriorly. Review of the MIP images confirms the above findings. CT ABDOMEN and PELVIS FINDINGS Hepatobiliary: Diffuse hepatic hypoattenuation compatible with hepatic steatosis. Sparing along the gallbladder fossa. No concerning focal liver lesion. Smooth liver surface contour. Normal gallbladder Pancreas: No pancreatic ductal dilatation or surrounding inflammatory changes. Spleen: Normal splenic size. Some wedge-shaped hypoattenuation is seen in the periphery of the inferior spleen best noted on coronal imaging 16/45) possibly related to artifact though a small splenic infarct could have this appearance. Adrenals/Urinary Tract: No concerning adrenal lesion. Kidneys  enhance symmetrically and uniformly. Symmetric renal excretion. No worrisome renal lesion. No urolithiasis or hydronephrosis. Urinary bladder is partially decompressed by inflated Foley catheter. Some excess catheter tubing is seen coiled within the urinary bladder Stomach/Bowel: Transesophageal tube tip and side port terminate within the gastric lumen. Diffuse edematous appearance of the gastric body with prominent rugal folds and mucosal hyperemia as well as extensive edematous mural changes of the duodenum, jejunum and portions of the ileum and portions of the proximal colon concerning for CT hypoperfusion complex/shock bowel in the setting of recent resuscitative efforts. No obstructive process is seen. Normal appendix in the right lower quadrant. Vascular/Lymphatic: Focal region of mid mesenteric hazy stranding with numerous reactive appearing clustered mid mesenteric lymph nodes compatible with mesenteritis (13/48). No other conspicuous or enlarged lymph nodes are seen in the abdomen or pelvis. No acute vascular abnormality. Portal and hepatic veins are widely patent. No major venous occlusions. Reproductive: Normal appearance of the uterus and adnexal structures. Other: No abdominopelvic free fluid or free gas. No bowel containing hernias. Focal skin thickening superficial to the sacrum as well as thickening along the subcutaneous fat plane. Additional focal skin thickening and soft tissue defect with gas and a rim enhancing air and fluid containing collection seen abutting the right ischial tuberosity. Musculoskeletal: Minimal cortical erosive changes of the right ischial tuberosity subjacent to the cutaneous/soft tissue defect. Additional minimal sclerotic changes and bony remodeling noted minimally at the. The osseous structures appear diffusely demineralized which may limit detection of small or nondisplaced fractures. Sacrum deep to overlying decubitus ulceration  Review of the MIP images confirms the  above findings. IMPRESSION: 1. Satisfactory opacification of pulmonary arteries. No evidence of pulmonary artery embolism. 2. Extensive regions of airless lung in both lower lobes likely reflecting some extensive atelectatic change. Additional centrilobular ground-glass and patchy consolidation in the aerated portions of the lungs may reflect some superimposed infectious process, sequela of aspiration and/or developing pulmonary edema in the setting of resuscitation. 3. Low positioning of the endotracheal tube 1 cm from the carina. Consider retraction 2-3 cm to position in the mid trachea. Secretions noted below the level of the endotracheal balloon. 4. Remote, nonunited left sixth and seventh lateral rib fractures. Remote right anterior fourth rib fracture. No acute rib or sternal fractures are seen. 5. Subcutaneous edematous changes subjacent to the posterior pacer pad. Correlate with visual inspection 6. Wedge-shaped hypoattenuation in the posteroinferior spleen may reflect a small splenic infarct. 7. Diffuse thickening of large and proximal small bowel loops with edematous mural changes and mucosal hyperemia suggestive of CT hypoperfusion complex/shock bowel. 8. Foley catheter in place. Excessive catheter tubing is noted within the bladder lumen. 9. Some mild sacral decubitus ulceration with minimal sclerotic changes of the adjacent sacral bone possibly reflecting some sequela of prior osteomyelitis. 10. More extensive decubitus ulceration and and cutaneous defect with minimal transcortical lucency of the right ischial tuberosity which could reflect early osteomyelitis. Electronically Signed   By: Lovena Le M.D.   On: 11/04/2020 06:04   DG Chest Portable 1 View  Result Date: 11-04-20 CLINICAL DATA:  Status post CPR. EXAM: PORTABLE CHEST 1 VIEW COMPARISON:  None. FINDINGS: An endotracheal tube is seen with its distal tip approximately 2.5 cm from the carina. Nasogastric tube is noted with its distal end  extending into the body of the stomach. Its distal side hole is approximately 6.2 cm distal to the gastroesophageal junction. Mildly increased suprahilar and infrahilar lung markings are noted. There is no evidence of a pleural effusion or pneumothorax. The heart size and mediastinal contours are within normal limits. Chronic appearing deformities of the lateral sixth and seventh left ribs are seen. IMPRESSION: Endotracheal tube and nasogastric tube positioning, as described above. Electronically Signed   By: Virgina Norfolk M.D.   On: 11/04/20 03:57    Pertinent labs & imaging results that were available during my care of the patient were reviewed by me and considered in my medical decision making (see chart for details).  Medications Ordered in ED Medications  sodium chloride 0.9 % bolus 1,000 mL (0 mLs Intravenous Stopped 11/04/2020 0428)    Followed by  sodium chloride 0.9 % bolus 1,000 mL (0 mLs Intravenous Stopped Nov 04, 2020 0428)    Followed by  0.9 %  sodium chloride infusion (1,000 mLs Intravenous New Bag/Given 11-04-20 0512)  fentaNYL (SUBLIMAZE) injection 100 mcg (has no administration in time range)  fentaNYL (SUBLIMAZE) injection 100 mcg (has no administration in time range)  propofol (DIPRIVAN) 1000 MG/100ML infusion (0 mcg/kg/min  49.9 kg Intravenous Hold 04-Nov-2020 0513)  norepinephrine (LEVOPHED) 4mg  in 240mL premix infusion (20 mcg/min Intravenous Rate/Dose Change 2020/11/04 0546)  0.9 %  sodium chloride infusion (has no administration in time range)  vancomycin (VANCOCIN) IVPB 1000 mg/200 mL premix (has no administration in time range)  ceFEPIme (MAXIPIME) 2 g in sodium chloride 0.9 % 100 mL IVPB (has no administration in time range)  docusate sodium (COLACE) capsule 100 mg (has no administration in time range)  polyethylene glycol (MIRALAX / GLYCOLAX) packet 17 g (has no administration in  time range)  EPINEPHrine (ADRENALIN) 4 mg in NS 250 mL (0.016 mg/mL) premix infusion (has no  administration in time range)  etomidate (AMIDATE) injection (20 mg Intravenous Given 20-Nov-2020 0313)  rocuronium (ZEMURON) injection (100 mg Intravenous Given 11/20/2020 0314)  sodium chloride 0.9 % bolus 1,000 mL (0 mLs Intravenous Stopped 2020/11/20 0353)  sodium chloride 0.9 % bolus 1,000 mL (0 mLs Intravenous Stopped 11-20-20 0353)  heparin injection 3,000 Units (3,000 Units Intravenous Given 11-20-2020 0350)  pantoprazole (PROTONIX) injection 40 mg (40 mg Intravenous Given 11/20/20 0353)  sodium chloride 0.9 % bolus 1,000 mL (0 mLs Intravenous Stopped 2020-11-20 0531)  ceFEPIme (MAXIPIME) 2 g in sodium chloride 0.9 % 100 mL IVPB (0 g Intravenous Stopped 11-20-20 0429)  metroNIDAZOLE (FLAGYL) IVPB 500 mg (0 mg Intravenous Stopped Nov 20, 2020 0512)  vancomycin (VANCOCIN) IVPB 1000 mg/200 mL premix (0 mg Intravenous Stopped 20-Nov-2020 0529)  iohexol (OMNIPAQUE) 350 MG/ML injection 100 mL (100 mLs Intravenous Contrast Given Nov 20, 2020 0511)                                                                                                                                    Procedures .1-3 Lead EKG Interpretation Performed by: Fatima Blank, MD Authorized by: Fatima Blank, MD     Interpretation: abnormal     ECG rate:  140   ECG rate assessment: tachycardic     Rhythm: sinus tachycardia     Ectopy: none     Conduction: normal   Procedure Name: Intubation Date/Time: Nov 20, 2020 6:37 AM Performed by: Fatima Blank, MD Pre-anesthesia Checklist: Patient identified, Patient being monitored, Emergency Drugs available, Timeout performed and Suction available Oxygen Delivery Method: Non-rebreather mask Preoxygenation: Pre-oxygenation with 100% oxygen Induction Type: Rapid sequence Ventilation: Mask ventilation without difficulty Laryngoscope Size: Glidescope Grade View: Grade I Tube size: 7.5 mm Number of attempts: 1 Airway Equipment and Method: Rigid stylet Placement Confirmation: ETT inserted through  vocal cords under direct vision,  CO2 detector and Breath sounds checked- equal and bilateral Secured at: 22 cm Tube secured with: ETT holder    .Critical Care Performed by: Fatima Blank, MD Authorized by: Fatima Blank, MD   Critical care provider statement:    Critical care time (minutes):  80   Critical care was necessary to treat or prevent imminent or life-threatening deterioration of the following conditions:  Cardiac failure, circulatory failure, dehydration, respiratory failure, sepsis and shock   Critical care was time spent personally by me on the following activities:  Discussions with consultants, evaluation of patient's response to treatment, examination of patient, ordering and performing treatments and interventions, ordering and review of laboratory studies, ordering and review of radiographic studies, pulse oximetry, re-evaluation of patient's condition, obtaining history from patient or surrogate and review of old charts   Care discussed with: admitting provider      (including critical care time)  Medical Decision Making / ED Course  I have reviewed the nursing notes for this encounter and the patient's prior records (if available in EHR or on provided paperwork).   Yacine Gorzynski was evaluated in Emergency Department on 2020/11/10 for the symptoms described in the history of present illness. She was evaluated in the context of the global COVID-19 pandemic, which necessitated consideration that the patient might be at risk for infection with the SARS-CoV-2 virus that causes COVID-19. Institutional protocols and algorithms that pertain to the evaluation of patients at risk for COVID-19 are in a state of rapid change based on information released by regulatory bodies including the CDC and federal and state organizations. These policies and algorithms were followed during the patient's care in the ED.  Status postcardiac arrest in route.  ROSC after CPR and a  dose of epinephrine. Airway secured with ET tube upon arrival. Broad differential considered including infection, malnutrition, PE, cardiac arrest, metabolic process.  Became hypotensive during evaluation but responded to IV fluids. She did end up requiring pressors eventually.  Blood work notable for leukocytosis. Stable hemoglobin. Chest x-ray without evidence of pneumonia. UA without evidence of infection. COVID/influenza negative. Tropes negative. Renal function intact.  Pending CT of her head, CTA/PE study, CT of the abdomen and pelvis.  Spoke with critical care who will admit the patient for continued work-up and management.  Additional attempts to contact husband unsuccessful.      Final Clinical Impression(s) / ED Diagnoses Final diagnoses:  Cardiac arrest [facility])      This chart was dictated using voice recognition software.  Despite best efforts to proofread,  errors can occur which can change the documentation meaning.   Fatima Blank, MD 10-Nov-2020 [phone]

## 2020-11-22 NOTE — Progress Notes (Signed)
Family at the bedside. Pt asystole on the monitor. Pt heart and lung sounds auscultated, none noted. Pt pronounced by Glendale Chard, RN and Deboraha Sprang, RN at 8916. Dr. Carlis Abbott notified.

## 2020-11-22 NOTE — ED Notes (Signed)
Pt placed on her LT side with intensivist at bedside. Pt O2 sat in 50's with HOB 30.

## 2020-11-22 NOTE — Progress Notes (Signed)
Patient is requiring increase oxygenation support. PEEP therapy is +16 at this time per MD Loanne Drilling. Patient is now PRONE for the indication of refractory hypoxemia.

## 2020-11-22 NOTE — Progress Notes (Signed)
Husband and sister updated at bedside. Patient's mother with her grandkids now. Her BP has been trending down despite 3 pressors. Her husband and sister understand that this is not reversible and she will almost certainly not be able to survive this. They do not want her to suffer. They understand that she is high risk of passing away in the next few hours. Liberalized visitation at this point. Her sister is calling her mother to come back to the hospital if possible.   Julian Hy, DO 29-Oct-2020 12:56 PM [facility]

## 2020-11-22 NOTE — Discharge Summary (Signed)
Physician Discharge Summary  Patient ID: Gabrielle Singh MRN: [id number] DOB/AGE: Jan 10, 1986 35 y.o.  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 was admitted from the emergency department critically ill.  She was managed aggressively with vasopressors, antibiotics, prone positioning, and lung protective ventilation.  Unfortunately due to rapidly progressive septic shock and multiorgan failure she expired within hours of coming to the intensive care unit with family at bedside.  She expired at 1303 on 2020/11/12  Consults:  PCCM General surgery  Significant Diagnostic Studies:  BMP Latest Ref Rng & Units 2020-11-12 Nov 12, 2020 11/12/20  Glucose 70 - 99 mg/dL 227(H) 386(H) -  BUN 6 - 20 mg/dL <5(L) <5(L) -  Creatinine 0.44 - 1.00 mg/dL 0.61 0.46 -  Sodium 135 - 145 mmol/L 135 132(L) 136  Potassium 3.5 - 5.1 mmol/L 2.8(L) <2.0(LL) 2.1(LL)  Chloride 98 - 111 mmol/L 105 99 -  CO2 22 - 32 mmol/L 18(L) 21(L) -  Calcium 8.9 - 10.3 mg/dL 5.3(LL) 5.9(LL) -    CBC    Component Value Date/Time   WBC 19.5 (H) Nov 12, 2020 0322   RBC 3.20 (L) 11-12-20 0322   HGB 9.5 (L) Nov 12, 2020 0640   HCT 28.0 (L) 11-12-2020 0640   PLT 423 (H) 12-Nov-2020 0322   MCV 100.3 (H) 11-12-20 0322   MCH 31.9 11-12-2020 0322   MCHC  31.8 11/12/20 0322   RDW 14.9 Nov 12, 2020 0322   LYMPHSABS 4.2 (H) Nov 12, 2020 0322   MONOABS 0.5 2020-11-12 0322   EOSABS 0.0 2020/11/12 0322   BASOSABS 0.1 Nov 12, 2020 0322     CT chest abdomen pelvis:  IMPRESSION: 1. Satisfactory opacification of pulmonary arteries. No evidence of pulmonary artery embolism. 2. Extensive regions of airless lung in both lower lobes likely reflecting some extensive atelectatic change. Additional centrilobular ground-glass and patchy consolidation in the aerated portions of the lungs may reflect some superimposed infectious process, sequela of aspiration and/or developing pulmonary edema in the setting of resuscitation. 3. Low positioning of the endotracheal tube 1 cm from the carina. Consider retraction 2-3 cm to position in the mid trachea. Secretions noted below the level of the endotracheal balloon. 4. Remote, nonunited left sixth and seventh lateral rib fractures. Remote right anterior fourth rib fracture. No acute rib or sternal fractures are seen. 5. Subcutaneous edematous changes subjacent to the posterior pacer pad. Correlate with visual inspection 6. Wedge-shaped hypoattenuation in the posteroinferior spleen may reflect a small splenic infarct. 7. Diffuse thickening of large and proximal small bowel loops with edematous mural changes and mucosal hyperemia suggestive of CT hypoperfusion complex/shock bowel. 8. Foley catheter in place. Excessive catheter tubing is noted within the bladder lumen. 9. Some mild sacral decubitus ulceration with minimal sclerotic changes of the adjacent sacral bone possibly reflecting some sequela of prior osteomyelitis. 10. More extensive decubitus ulceration and and cutaneous defect with minimal transcortical lucency of the right ischial tuberosity which could reflect early osteomyelitis.  Blood cultures 1/4 staph hominis   Treatments:  Antibiotics Vasopressors Mechanical ventilation Respiratory  treatments and  prone positioning IV fluids Electrolyte repletion and monitoring Sedation and analgesia Insulin   Disposition: funeral home of the family's choosing   Signed: Julian Hy 10/24/2020, 6:31 PM

## 2020-11-22 NOTE — ED Triage Notes (Addendum)
Pt bib EMS from home where pt was found unresponsive by husband. 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 Pt was at her normal state at around 9pm; pt was then found to be unresponsive at 1:15 am. Pt was breathing and had pulses when EMS got to pt, however, pt's radial pulses were absent. Pt became apneic on the way to hospital and was bagged. Pt lost pulses, was in PEA, and CPR initiated for 6 minutes. 1 epi given PTA and ROSC was obtained with no defibrillation. King airway placed PTA.

## 2020-11-22 NOTE — Progress Notes (Signed)
Patient transported to CT scan anc back to Trauma B without incidence.

## 2020-11-22 NOTE — Procedures (Signed)
Central Venous Catheter Insertion Procedure Note  Gabrielle Singh  [id number]  Nov 09, 1985  Date:11-04-20  Time:7:04 AM   Provider Performing:Almeta Geisel Rodman Pickle   Procedure: Insertion of Non-tunneled Central Venous Catheter(36556) with US guidance (60630)   Indication(s) Medication administration  Consent Unable to obtain consent due to emergent nature of procedure.  Anesthesia None  Timeout Verified patient identification, verified procedure, site/side was marked, verified correct patient position, special equipment/implants available, medications/allergies/relevant history reviewed, required imaging and test results available.  Sterile Technique Maximal sterile technique including full sterile barrier drape, hand hygiene, sterile gown, sterile gloves, mask, hair covering, sterile ultrasound probe cover (if used).  Procedure Description Area of catheter insertion was cleaned with chlorhexidine and draped in sterile fashion.  With real-time ultrasound guidance a central venous catheter was placed into the left femoral vein. Emergent and immediate access needed in rapidly deteriorating patient with SBP in the 50s. Unable to lay patient flat due to severe desaturations in the 60s. Femoral vein was best access at the time. Nonpulsatile blood flow and easy flushing noted in all ports.  The catheter was sutured in place and sterile dressing applied.  Complications/Tolerance None; patient tolerated the procedure well. Chest X-ray is ordered to verify placement for internal jugular or subclavian cannulation.   Chest x-ray is not ordered for femoral cannulation.  EBL Minimal  Specimen(s) None  Rodman Pickle, M.D. [facility] 11/04/20 7:06 AM

## 2020-11-22 NOTE — ED Notes (Signed)
Pt back from CT. Pt BP dropped to 33'A systolic. Fluids on pressure bag at this time.

## 2020-11-22 NOTE — Procedures (Signed)
Procedure  Failed arterial line  Emergency indication shock   Time out w/ Nurse Pt prepped w/ chlorahex Sterile drape placed Using real time [place] the right radial artery was successfully cannulated X 2 BUT was unable to tread catheter over guidewire due to small arterial lumen size. Could consider femoral access once the patient is back in supine position. Will defer to Dr Carlis Abbott  Aborted radial access attempt  Erick Colace ACNP-BC [place] Pager # [phone] OR # [phone] if no answer

## 2020-11-22 NOTE — Progress Notes (Signed)
Pt arrived from ER. Pt quickly assessed and noted to be unresponsive and flaccid in all extremities, no sedation infusing. Pt has no cough, gag or corneal reflex. Pupils 3 and non reactive. Pt had bilateral PIV in each AC, with levophed infusing. IO noted in lower leg. CCM MD emergently placed CVC and then emergently proned with RT and multiple RNs. OG tube and foley already present.   Pt noted to have MASD under bilateral breasts along with 2 unstable wounds on sacrum. On coming RN aware of wounds and will follow up on wound charting.

## 2020-11-22 NOTE — TOC Transition Note (Signed)
Transition of Care [facility]) - CM/SW Discharge Note   Patient Details  Name: Elspeth Blucher MRN: [id number] Date of Birth: 02/04/1986  [facility]) CM/SW Contact:  Bartholomew Crews, RN Phone Number: [phone] 11/15/2020, 2:40 PM   Clinical Narrative:     Acknowledging [facility] consult for family requesting assistance with guardianship. Spoke with nursing - advised that this was a legal issue requiring a Chief Executive Officer.   Final next level of care: Expired Barriers to Discharge: No Barriers Identified   Patient Goals and CMS Choice        Discharge Placement                       Discharge Plan and Services                                     Social Determinants of Health (SDOH) Interventions     Readmission Risk Interventions No flowsheet data found.

## 2020-11-22 NOTE — Progress Notes (Signed)
Code Sepsis initiated @ 8546 AM, [place] following.

## 2020-11-22 NOTE — ED Notes (Signed)
Bair hugger placed on pt at this time due to rectal temp of 95.8

## 2020-11-22 NOTE — Progress Notes (Signed)
Two RT's have attempted ABG x3 without success. MD is aware and is going to attempt ABG.

## 2020-11-22 NOTE — H&P (Signed)
NAME:  Gabrielle Singh, MRN:  [id number], DOB:  1986-04-15, LOS: 0 ADMISSION DATE:  11-17-2020, CONSULTATION DATE:  Nov 17, 2020 REFERRING MD:  Dr. Leonette Monarch, CHIEF COMPLAINT:  Sepsis    History of Present Illness:  Gabrielle Singh is a 35 y.o. female with a PMH significant for paralysis of unknown etiology, bulimia as a teenager and rheumatoid arthritis who presented to the ER after being found down by husband. Per husband patient was normal at 9pm but in recheck at 1:15 she was unresponsive prompting EMS call. On EMS arrival patient was unresponsive but had pulses. In route to ED patient became pulseless and found to be in PEA, ACLS protocol initiated and patient received 6 minutes of CPR and 1 of Epi before ROSC was achieved.    Workup on admission consistent with sepsis with profound hypotension, tachycardia, and hypotension. Labwork significant for Na 130, Cl 88, glucose 250, albumin 20, lactic > 11, wbc 19.5, and hgb 10.2.   Pertinent  Medical History  Paralysis of unknown etiology, bulimia as a teenager and rheumatoid arthritis  Significant Hospital Events: Including procedures, antibiotic start and stop dates in addition to other pertinent events   . 5/2 Admitted with sever sepsis in multisystem organ failure   Interim History / Subjective:  As above   Objective   Blood pressure (!) 87/59, pulse 96, temperature (!) 93.3 F (34.1 C), resp. rate (!) 28, height 5\' 4"  (1.626 m), weight 49.9 kg, SpO2 90 %.    Vent Mode: PRVC FiO2 (%):  [100 %] 100 % Set Rate:  [28 bmp] 28 bmp Vt Set:  [320 mL-440 mL] 320 mL PEEP:  [5 cmH20-10 cmH20] 10 cmH20 Plateau Pressure:  [18 cmH20-27 cmH20] 27 cmH20   Intake/Output Summary (Last 24 hours) at Nov 17, 2020 0559 Last data filed at 2020-11-17 0531 Gross per 24 hour  Intake 5400 ml  Output --  Net 5400 ml   Filed Weights   11/17/2020 0321  Weight: 49.9 kg    Examination: General: Acute on chronic ill appearing adult female on mechanical ventilation,  in NAD HEENT: ETT, MM pink/moist, PERRL,  Neuro: Unresponsive  CV: s1s2 regular rate and rhythm, no murmur, rubs, or gallops,  PULM:  Severe bilateral rhonchi with profound hypoxia despite maxed vent settings GI: soft, bowel sounds active in all 4 quadrants, non-tender, non-distended Extremities: warm/dry, no edema  Skin: no rashes or lesions  Labs/imaging that I havepersonally reviewed   CTA chest/ABD/PElvis 2020/11/17 > 1. Satisfactory opacification of pulmonary arteries. No evidence of pulmonary artery embolism. 2. Extensive regions of airless lung in both lower lobes likely reflecting some extensive atelectatic change. Additional centrilobular ground-glass and patchy consolidation in the aerated portions of the lungs may reflect some superimposed infectious process, sequela of aspiration and/or developing pulmonary edema in the setting of resuscitation. 3. Low positioning of the endotracheal tube 1 cm from the carina. Consider retraction 2-3 cm to position in the mid trachea. Secretions noted below the level of the endotracheal balloon. 4. Remote, nonunited left sixth and seventh lateral rib fractures. Remote right anterior fourth rib fracture. No acute rib or sternal fractures are seen. 5. Subcutaneous edematous changes subjacent to the posterior pacer pad. Correlate with visual inspection 6. Wedge-shaped hypoattenuation in the posteroinferior spleen may reflect a small splenic infarct. 7. Diffuse thickening of large and proximal small bowel loops with edematous mural changes and mucosal hyperemia suggestive of CT hypoperfusion complex/shock bowel. 8. Foley catheter in place. Excessive catheter tubing is noted within the bladder lumen.  9. Some mild sacral decubitus ulceration with minimal sclerotic changes of the adjacent sacral bone possibly reflecting some sequela of prior osteomyelitis. 10. More extensive decubitus ulceration and and cutaneous defect with minimal  transcortical lucency of the right ischial tuberosity which could reflect early osteomyelitis.  Head CT 11-20-2020 > Negative   Resolved Hospital Problem list     Assessment & Plan:  Severe septic shock  -Criteria: Temperature 93.9, R 137, b/p 70/36, wbc 19.5, lactic acid > 11 -Multiple sources including severe unstageable sacral wound with signs of osteomyelitis, aspiration PNA with ARSD, and shock/ischemic bowel  Ischemic bowel Unstageable pressure ulcer  P: Admit ICU Vent support as below  Pan cultures prior to antibiotic IV antibiotics vanc and cefepime  Aggressive IV hydration provided in ED Pressor support with levo and Epi  Trend lactic acid Monitor urine output Capillary refill: General surgery consulted for evaluation of ischemic bowel  Wound care  WOC consult   Acute hypoxic/hypercapnic respiratory failure due to severe ARDS with underlying aspiration PNA P: Prone positioning  Continue ventilator support with lung protective strategies per ARDS protocol  Wean PEEP and FiO2 for sats greater than 88%. Head of bed elevated 30 degrees. Plateau pressures less than 30 cm H20 as able Driving pressure goal less than 15 Follow intermittent chest x-ray and ABG Ensure adequate pulmonary hygiene  Follow cultures  VAP bundle in place  PAD protocol  Hx of paralysis of unknown etiology  -Patents mother states patient refused treatment of paralysis on most recent hospitalization in October 2021 P: Supportive care Neuro consult pending stabilization   Hyperglycemia  P; SSI CBG q4   Best practice   Diet:  NPO Pain/Anxiety/Delirium protocol (if indicated): Yes (RASS goal -1) VAP protocol (if indicated): Yes DVT prophylaxis: Contraindicated GI prophylaxis: PPI Glucose control:  SSI Yes Central venous access:  N/A Arterial line:  N/A Foley:  Yes, and it is still needed Mobility:  bed rest  PT consulted: N/A Last date of multidisciplinary goals of care discussion  Critical illness expressed to mom who verbalized understanding, she wishes to communicate with family regarding plan before any decision is made Code Status:  full code Disposition: ICU  Labs   CBC: Recent Labs  Lab 11/20/2020 0322 11/20/20 0411 2020/11/20 0437  WBC 19.5*  --   --   NEUTROABS 13.9*  --   --   HGB 10.2* 6.1* 6.5*  HCT 32.1* 18.0* 19.0*  MCV 100.3*  --   --   PLT 423*  --   --     Basic Metabolic Panel: Recent Labs  Lab 11-20-20 0322 20-Nov-2020 0411 Nov 20, 2020 0437  NA 130* 135 137  K 3.5 2.3* <2.0*  CL 88* 95*  --   CO2 22  --   --   GLUCOSE 250* 151*  --   BUN <5* <3*  --   CREATININE 0.60 <0.20*  --   CALCIUM 8.2*  --   --   MG 1.9  --   --    GFR: CrCl cannot be calculated (This lab value cannot be used to calculate CrCl because it is not a number: <0.20). Recent Labs  Lab 2020/11/20 0322 11/20/20 0325  WBC 19.5*  --   LATICACIDVEN  --  >11.0*    Liver Function Tests: Recent Labs  Lab 11/20/20 0322  AST 119*  ALT 22  ALKPHOS 100  BILITOT 0.7  PROT 5.4*  ALBUMIN 1.4*   No results for input(s): LIPASE, AMYLASE in the  last 168 hours. No results for input(s): AMMONIA in the last 168 hours.  ABG    Component Value Date/Time   PHART 7.266 (L) Nov 04, 2020 0437   PCO2ART 38.8 Nov 04, 2020 0437   PO2ART 166 (H) 11-04-2020 0437   HCO3 18.4 (L) 11-04-2020 0437   TCO2 20 (L) 11-04-20 0437   ACIDBASEDEF 9.0 (H) 2020-11-04 0437   O2SAT 99.0 Nov 04, 2020 0437     Coagulation Profile: Recent Labs  Lab 11-04-20 0322  INR 1.1    Cardiac Enzymes: Recent Labs  Lab 2020-11-04 0322  CKTOTAL 44    HbA1C: Hgb A1c MFr Bld  Date/Time Value Ref Range Status  04/21/2020 05:55 AM 4.7 (L) 4.8 - 5.6 % Final    Comment:    (NOTE)         Prediabetes: 5.7 - 6.4         Diabetes: >6.4         Glycemic control for adults with diabetes: <7.0     CBG: No results for input(s): GLUCAP in the last 168 hours.  Review of Systems:   Unable to gather due  to critical illness   Past Medical History:  She,  has no past medical history on file.   Surgical History:  History reviewed. No pertinent surgical history.   Social History:   reports that she has never smoked. She has never used smokeless tobacco. She reports previous alcohol use. She reports previous drug use.   Family History:  Her family history is not on file.   Allergies No Known Allergies   Home Medications  Prior to Admission medications   Medication Sig Start Date End Date Taking? Authorizing Provider  feeding supplement (ENSURE ENLIVE / ENSURE PLUS) LIQD Take 237 mLs by mouth 3 (three) times daily between meals. 04/21/20   Virl Axe, MD  folic acid (FOLVITE) 1 MG tablet Take 1 tablet (1 mg total) by mouth daily. 04/22/20   Virl Axe, MD  Multiple Vitamin (MULTIVITAMIN WITH MINERALS) TABS tablet Take 1 tablet by mouth daily. 04/22/20   Virl Axe, MD  Nutritional Supplements (,FEEDING SUPPLEMENT, PROSOURCE PLUS) liquid Take 30 mLs by mouth 2 (two) times daily between meals. 04/21/20   Virl Axe, MD  pantoprazole (PROTONIX) 40 MG tablet Take 1 tablet (40 mg total) by mouth 2 (two) times daily. 04/21/20   Virl Axe, MD  polyethylene glycol (MIRALAX / GLYCOLAX) 17 g packet Take 17 g by mouth at bedtime. 04/21/20   Virl Axe, MD  thiamine 100 MG tablet Take 1 tablet (100 mg total) by mouth daily. 04/22/20   Virl Axe, MD     Critical care time:    Performed by: Johnsie Cancel  Total critical care time: 65 minutes  Critical care time was exclusive of separately billable procedures and treating other patients.  Critical care was necessary to treat or prevent imminent or life-threatening deterioration.  Critical care was time spent personally by me on the following activities: development of treatment plan with patient and/or surrogate as well as nursing, discussions with consultants, evaluation of patient's response to treatment,  examination of patient, obtaining history from patient or surrogate, ordering and performing treatments and interventions, ordering and review of laboratory studies, ordering and review of radiographic studies, pulse oximetry and re-evaluation of patient's condition.  Johnsie Cancel, NP-C [facility] Personal contact information can be found on Amion  If no response please page: Adult pulmonary and critical care medicine pager on [facility]  After 7pm please call [phone] 2020-10-25, 7:28 AM

## 2020-11-22 NOTE — Progress Notes (Signed)
Date and time results received: Nov 19, 2020 at 0943   Test: BMP Critical Value: K < 2.0, Ca 5.9  Name of Provider Notified: Dr. Noemi Chapel  Orders Received? Or Actions Taken?: Dr. Carlis Abbott notified, Ca and K replaced.

## 2020-11-22 NOTE — Progress Notes (Incomplete)
Echo attempted but patient is in prone position. Will try again later.   Dustin Flock, RCS

## 2020-11-22 NOTE — Consult Note (Signed)
Reason for Consult/Chief Complaint: bowel ischemia Consultant: Carlis Abbott, MD  Gabrielle Singh is an 35 y.o. female.   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 During transport had circulatory arrest with 63m CPR. She has continued to decompensate since presentation with florid ARDS requiring pronation. She has known decubitus ulcers of bilateral hips and CT imaging was notable for bowel hypoperfusion.   History reviewed. No pertinent past medical history.  History reviewed. No pertinent surgical history.  History reviewed. No pertinent family history.  Social History:  reports that she has never smoked. She has never used smokeless tobacco. She reports previous alcohol use. She reports previous drug use.  Allergies: No Known Allergies  Medications: I have reviewed the patient's current medications.  Results for orders placed or performed during the hospital encounter of 11/20/20 (from the past 48 hour(s))  CBC with Differential/Platelet     Status: Abnormal   Collection Time: 11/20/20  3:22 AM  Result Value Ref Range   WBC 19.5 (H) 4.0 - 10.5 K/uL   RBC 3.20 (L) 3.87 - 5.11 MIL/uL   Hemoglobin 10.2 (L) 12.0 - 15.0 g/dL   HCT 32.1 (L) 36.0 - 46.0 %   MCV 100.3 (H) 80.0 - 100.0 fL   MCH 31.9 26.0 - 34.0 pg   MCHC 31.8 30.0 - 36.0 g/dL   RDW 14.9 11.5 - 15.5 %   Platelets 423 (H) 150 - 400 K/uL   nRBC 0.7 (H) 0.0 - 0.2 %   Neutrophils Relative % 71 %   Neutro Abs 13.9 (H) 1.7 - 7.7 K/uL   Lymphocytes Relative 21 %   Lymphs Abs 4.2 (H) 0.7 - 4.0 K/uL   Monocytes Relative 3 %   Monocytes Absolute 0.5 0.1 - 1.0 K/uL   Eosinophils Relative 0 %   Eosinophils Absolute 0.0 0.0 - 0.5 K/uL   Basophils Relative 1 %   Basophils Absolute 0.1 0.0 - 0.1 K/uL   Immature Granulocytes 4 %   Abs Immature Granulocytes 0.80 (H) 0.00 - 0.07 K/uL    Comment: Performed at [facility], [address], [place], [place] [address]  Protime-INR      Status: None   Collection Time: 11-20-2020  3:22 AM  Result Value Ref Range   Prothrombin Time 14.6 11.4 - 15.2 seconds   INR 1.1 0.8 - 1.2    Comment: (NOTE) INR goal varies based on device and disease states. Performed at [facility], [place] [address], [place], [place] [address]   APTT     Status: None   Collection Time: 11-20-2020  3:22 AM  Result Value Ref Range   aPTT 30 24 - 36 seconds    Comment: Performed at [facility], [place] [address], [place], [place] [address]  Comprehensive metabolic panel     Status: Abnormal   Collection Time: Nov 20, 2020  3:22 AM  Result Value Ref Range   Sodium 130 (L) 135 - 145 mmol/L   Potassium 3.5 3.5 - 5.1 mmol/L   Chloride 88 (L) 98 - 111 mmol/L   CO2 22 22 - 32 mmol/L   Glucose, Bld 250 (H) 70 - 99 mg/dL   BUN <5 (L) 6 - 20 mg/dL   Creatinine, Ser 0.60 0.44 - 1.00 mg/dL   Calcium 8.2 (L) 8.9 - 10.3 mg/dL   Total Protein 5.4 (L) 6.5 - 8.1 g/dL   Albumin 1.4 (L) 3.5 - 5.0 g/dL  AST 119 (H) 15 - 41 U/L   ALT 22 0 - 44 U/L    Comment: RESULTS CONFIRMED BY MANUAL DILUTION   Alkaline Phosphatase 100 38 - 126 U/L   Total Bilirubin 0.7 0.3 - 1.2 mg/dL   GFR, Estimated >60 >60 mL/min    Comment: (NOTE) Calculated using the CKD-EPI Creatinine Equation (2021)    Anion gap 20 (H) 5 - 15    Comment: Performed at [facility] [address] [place], [place] [address]  Troponin I (High Sensitivity)     Status: None   Collection Time: 2020-10-26  3:22 AM  Result Value Ref Range   Troponin I (High Sensitivity) 12 <18 ng/L    Comment: Performed at [facility] [address] [place], [place] [address]  CK     Status: None   Collection Time: 26-Oct-2020  3:22 AM  Result Value Ref Range   Total CK 44 38 - 234 U/L    Comment: Performed at [facility], [place] [address] [place], [place] [address]  Magnesium     Status: None   Collection Time: 26-Oct-2020  3:22 AM  Result Value Ref Range   Magnesium 1.9 1.7 - 2.4 mg/dL     Comment: Performed at [facility], [place] [address] [place], [place] [address]  Lactic acid, plasma     Status: Abnormal   Collection Time: Oct 26, 2020  3:25 AM  Result Value Ref Range   Lactic Acid, Venous >11.0 (HH) 0.5 - 1.9 mmol/L    Comment: CRITICAL RESULT CALLED TO, READ BACK BY AND VERIFIED WITH:  L. ELLIS RN @[id number]  Oct 26, 2020 K. SANDERS Performed at [facility], [place] [address] [place], [place] [address]   Urinalysis, Routine w reflex microscopic Urine, Catheterized     Status: Abnormal   Collection Time: Oct 26, 2020  3:25 AM  Result Value Ref Range   Color, Urine YELLOW YELLOW   APPearance CLEAR CLEAR   Specific Gravity, Urine >1.030 (H) 1.005 - 1.030   pH 5.5 5.0 - 8.0   Glucose, UA 100 (A) NEGATIVE mg/dL   Hgb urine dipstick SMALL (A) NEGATIVE   Bilirubin Urine NEGATIVE NEGATIVE   Ketones, ur NEGATIVE NEGATIVE mg/dL   Protein, ur 100 (A) NEGATIVE mg/dL   Nitrite NEGATIVE NEGATIVE   Leukocytes,Ua NEGATIVE NEGATIVE    Comment: Performed at [facility] [address] [place], [place] [address]  Urinalysis, Microscopic (reflex)     Status: Abnormal   Collection Time: 10/26/20  3:25 AM  Result Value Ref Range   RBC / HPF 0-5 0 - 5 RBC/hpf   WBC, UA 6-10 0 - 5 WBC/hpf   Bacteria, UA RARE (A) NONE SEEN   Squamous Epithelial / LPF 0-5 0 - 5   Mucus PRESENT    Hyaline Casts, UA PRESENT     Comment: Performed at [facility], [place] [address] [place],  [address]  Resp Panel by RT-PCR (Flu A&B, Covid) Nasopharyngeal Swab     Status: None   Collection Time: 2020/10/26  3:43 AM   Specimen: Nasopharyngeal Swab; Nasopharyngeal(NP) swabs in vial transport medium  Result Value Ref Range   SARS Coronavirus 2 by RT PCR NEGATIVE NEGATIVE    Comment: (NOTE) SARS-CoV-2 target nucleic acids are NOT DETECTED.  The SARS-CoV-2 RNA is generally detectable in upper respiratory specimens during the acute phase of infection. The lowest concentration of SARS-CoV-2 viral  copies this assay can  detect is 138 copies/mL. A negative result does not preclude SARS-Cov-2 infection and should not be used as the sole basis for treatment or other patient management decisions. A negative result may occur with  improper specimen collection/handling, submission of specimen other than nasopharyngeal swab, presence of viral mutation(s) within the areas targeted by this assay, and inadequate number of viral copies(<138 copies/mL). A negative result must be combined with clinical observations, patient history, and epidemiological information. The expected result is Negative.  Fact Sheet for Patients:  EntrepreneurPulse.com.au  Fact Sheet for Healthcare Providers:  IncredibleEmployment.be  This test is no t yet approved or cleared by the [place] FDA and  has been authorized for detection and/or diagnosis of SARS-CoV-2 by FDA under an Emergency Use Authorization (EUA). This EUA will remain  in effect (meaning this test can be used) for the duration of the COVID-19 declaration under Section 564(b)(1) of the Act, 21 U.S.C.section 360bbb-3(b)(1), unless the authorization is terminated  or revoked sooner.       Influenza A by PCR NEGATIVE NEGATIVE   Influenza B by PCR NEGATIVE NEGATIVE    Comment: (NOTE) The Xpert Xpress SARS-CoV-2/FLU/RSV plus assay is intended as an aid in the diagnosis of influenza from Nasopharyngeal swab specimens and should not be used as a sole basis for treatment. Nasal washings and aspirates are unacceptable for Xpert Xpress SARS-CoV-2/FLU/RSV testing.  Fact Sheet for Patients: EntrepreneurPulse.com.au  Fact Sheet for Healthcare Providers: IncredibleEmployment.be  This test is not yet approved or cleared by the [place] FDA and has been authorized for detection and/or diagnosis of SARS-CoV-2 by FDA under an Emergency Use Authorization (EUA). This EUA will  remain in effect (meaning this test can be used) for the duration of the COVID-19 declaration under Section 564(b)(1) of the Act, 21 U.S.C. section 360bbb-3(b)(1), unless the authorization is terminated or revoked.  Performed at [facility], [place] [address], [place], [place] [address]   Blood culture (routine x 2)     Status: None (Preliminary result)   Collection Time: Nov 21, 2020  3:51 AM   Specimen: BLOOD  Result Value Ref Range   Specimen Description BLOOD RIGHT ANTECUBITAL    Special Requests      BOTTLES DRAWN AEROBIC AND ANAEROBIC Blood Culture adequate volume   Culture      NO GROWTH < 12 HOURS Performed at [facility], [place] [address] [place], [place] [address]    Report Status PENDING   Blood culture (routine x 2)     Status: None (Preliminary result)   Collection Time: 11/21/20  3:55 AM   Specimen: BLOOD  Result Value Ref Range   Specimen Description BLOOD LEFT ANTECUBITAL    Special Requests      BOTTLES DRAWN AEROBIC AND ANAEROBIC Blood Culture adequate volume   Culture      NO GROWTH < 12 HOURS Performed at [facility], [place] [address] [place], [place] [address]    Report Status PENDING   POC occult blood, ED RN will collect     Status: None   Collection Time: 11/21/20  3:59 AM  Result Value Ref Range   Fecal Occult Bld NEGATIVE NEGATIVE  I-Stat beta hCG blood, ED     Status: None   Collection Time: 2020/11/21  4:10 AM  Result Value Ref Range   I-stat hCG, quantitative <5.0 <5 mIU/mL   Comment 3            Comment:   GEST. AGE  CONC.  (mIU/mL)   <=1 WEEK        5 - 50     2 WEEKS       50 - 500     3 WEEKS       100 - 10,000     4 WEEKS     1,000 - 30,000        FEMALE AND NON-PREGNANT FEMALE:     LESS THAN 5 mIU/mL   I-Stat Chem 8, ED     Status: Abnormal   Collection Time: Nov 02, 2020  4:11 AM  Result Value Ref Range   Sodium 135 135 - 145 mmol/L   Potassium 2.3 (LL) 3.5 - 5.1 mmol/L   Chloride 95 (L) 98 - 111 mmol/L   BUN <3 (L)  6 - 20 mg/dL   Creatinine, Ser <9.23 (L) 0.44 - 1.00 mg/dL   Glucose, Bld 300 (H) 70 - 99 mg/dL    Comment: Glucose reference range applies only to samples taken after fasting for at least 8 hours.   Calcium, Ion 1.01 (L) 1.15 - 1.40 mmol/L   TCO2 22 22 - 32 mmol/L   Hemoglobin 6.1 (LL) 12.0 - 15.0 g/dL   HCT 76.2 (L) 26.3 - 33.5 %   Comment NOTIFIED PHYSICIAN   I-Stat arterial blood gas, ED     Status: Abnormal   Collection Time: 2020-11-02  4:37 AM  Result Value Ref Range   pH, Arterial 7.266 (L) 7.350 - 7.450   pCO2 arterial 38.8 32.0 - 48.0 mmHg   pO2, Arterial 166 (H) 83.0 - 108.0 mmHg   Bicarbonate 18.4 (L) 20.0 - 28.0 mmol/L   TCO2 20 (L) 22 - 32 mmol/L   O2 Saturation 99.0 %   Acid-base deficit 9.0 (H) 0.0 - 2.0 mmol/L   Sodium 137 135 - 145 mmol/L   Potassium <2.0 (LL) 3.5 - 5.1 mmol/L   Calcium, Ion 1.02 (L) 1.15 - 1.40 mmol/L   HCT 19.0 (L) 36.0 - 46.0 %   Hemoglobin 6.5 (LL) 12.0 - 15.0 g/dL   Patient temperature 45.6 F    Collection site Radial    Drawn by RT    Sample type ARTERIAL    Comment NOTIFIED PHYSICIAN   Lactic acid, plasma     Status: Abnormal   Collection Time: 2020/11/02  5:47 AM  Result Value Ref Range   Lactic Acid, Venous 9.3 (HH) 0.5 - 1.9 mmol/L    Comment: CRITICAL VALUE NOTED.  VALUE IS CONSISTENT WITH PREVIOUSLY REPORTED AND CALLED VALUE. Performed at [facility] : [facility], [address], [place], [place] [address]   Glucose, capillary     Status: Abnormal   Collection Time: 11-02-20  6:37 AM  Result Value Ref Range   Glucose-Capillary 180 (H) 70 - 99 mg/dL    Comment: Glucose reference range applies only to samples taken after fasting for at least 8 hours.  I-STAT 7, (LYTES, BLD GAS, ICA, H+H)     Status: Abnormal   Collection Time: Nov 02, 2020  6:40 AM  Result Value Ref Range   pH, Arterial 7.071 (LL) 7.350 - 7.450   pCO2 arterial 56.8 (H) 32.0 - 48.0 mmHg   pO2, Arterial 44 (L) 83.0 - 108.0 mmHg   Bicarbonate 17.1 (L) 20.0 - 28.0 mmol/L    TCO2 19 (L) 22 - 32 mmol/L   O2 Saturation 68.0 %   Acid-base deficit 13.0 (H) 0.0 - 2.0 mmol/L   Sodium 136 135 - 145 mmol/L  Potassium 2.1 (LL) 3.5 - 5.1 mmol/L   Calcium, Ion 1.04 (L) 1.15 - 1.40 mmol/L   HCT 28.0 (L) 36.0 - 46.0 %   Hemoglobin 9.5 (L) 12.0 - 15.0 g/dL   Patient temperature 34.7 C    Sample type ARTERIAL    Comment NOTIFIED PHYSICIAN   Glucose, capillary     Status: Abnormal   Collection Time: 11/09/2020  7:35 AM  Result Value Ref Range   Glucose-Capillary 330 (H) 70 - 99 mg/dL    Comment: Glucose reference range applies only to samples taken after fasting for at least 8 hours.  Hemoglobin A1c     Status: None   Collection Time: 11/09/2020  8:00 AM  Result Value Ref Range   Hgb A1c MFr Bld 5.0 4.8 - 5.6 %    Comment: (NOTE) Pre diabetes:          5.7%-6.4%  Diabetes:              >6.4%  Glycemic control for   <7.0% adults with diabetes    Mean Plasma Glucose 96.8 mg/dL    Comment: Performed at [facility] [address], [place], [place] [address]    CT Head Wo Contrast  Result Date: 11/09/2020 CLINICAL DATA:  Mental status change, unknown cause EXAM: CT HEAD WITHOUT CONTRAST TECHNIQUE: Contiguous axial images were obtained from the base of the skull through the vertex without intravenous contrast. COMPARISON:  CT head 04/15/2020, MR 04/17/2020 FINDINGS: Brain: No evidence of acute infarction, hemorrhage, hydrocephalus, extra-axial collection, visible mass lesion or mass effect. Vascular: No hyperdense vessel or unexpected calcification. Skull: Right frontal and periorbital soft tissue swelling without calvarial fracture or visible facial bone fracture within the margins of imaging. Sinuses/Orbits: Right periorbital soft tissue swelling. No retro septal gas, stranding or hemorrhage. Globes appear normal and symmetric. Thickening in the nasal passages with some pneumatized secretions and layering air-fluid levels in the maxillary and sphenoid sinuses likely  related to instrumentation/intubation seen on contemporary CT of the chest and abdomen. Other: Mastoid air cells and middle ear cavities are clear. IMPRESSION: No acute intracranial abnormality. Right periorbital and frontal soft tissue swelling without calvarial or facial bone fracture within the margins of imaging. No globe or retro septal injury is identified. Pneumatized secretions and air-fluid levels in the paranasal sinuses with thickening of the nasal passages likely related to intubation. Electronically Signed   By: Lovena Le M.D.   On: 2020/11/09 05:44   CT Angio Chest PE W and/or Wo Contrast  Result Date: 2020-11-09 CLINICAL DATA:  Altered mental status, found unresponsive, known sickle ulcerations. EXAM: CT ANGIOGRAPHY CHEST CT ABDOMEN AND PELVIS WITH CONTRAST TECHNIQUE: Multidetector CT imaging of the chest was performed using the standard protocol during bolus administration of intravenous contrast. Multiplanar CT image reconstructions and MIPs were obtained to evaluate the vascular anatomy. Multidetector CT imaging of the abdomen and pelvis was performed using the standard protocol during bolus administration of intravenous contrast. CONTRAST:  155mL OMNIPAQUE IOHEXOL 350 MG/ML SOLN COMPARISON:  CT 04/15/2020 FINDINGS: CTA CHEST FINDINGS Cardiovascular: Satisfactory opacification the pulmonary arteries to the segmental level. No pulmonary artery filling defects are identified. Central pulmonary arteries are normal caliber. Normal heart size. No pericardial effusion. The aortic root is suboptimally assessed given cardiac pulsation artifact. The aorta is normal caliber. Left vertebral artery arises directly from the aortic arch between the left common carotid and subclavian artery origins. No acute abnormality of proximal great vessels. No major venous abnormality.  No significant venous reflux. Mediastinum/Nodes: Endotracheal tube tip terminates in the low trachea, approximately 1 cm from the  carina (9/57). Scattered secretions are seen below the level of the endotracheal balloon. Transesophageal tube tip and side port are distal to the GE junction. No worrisome thyroid nodules or masses. No conspicuous mediastinal, hilar or axillary adenopathy. Lungs/Pleura: Extensive areas of airless lung present throughout both lower lobes widespread multifocal patchy centrilobular ground-glass opacities and more diffuse consolidative densities elsewhere throughout both lungs. Some mild septal thickening is present. Scattered fluid-filled and thickened airways are noted. No sizable pneumothorax or visible effusion. Musculoskeletal: Remote appearing nonunited live sixth and seventh lateral rib fractures. Additional remote right anterior fourth rib fracture as well. No visible acute displaced rib fractures. No sternal fractures are identified. Costal cartilages appear intact. No acute osseous abnormality of the included shoulders. No acute fracture or traumatic listhesis of the thoracic spine. Mild dextrocurvature centered at T8-T9. Some edematous changes are noted subjacent to the pacer pads along the posterior left chest wall, nonspecific given recent resuscitative efforts. Additional pacer pad anteriorly. Review of the MIP images confirms the above findings. CT ABDOMEN and PELVIS FINDINGS Hepatobiliary: Diffuse hepatic hypoattenuation compatible with hepatic steatosis. Sparing along the gallbladder fossa. No concerning focal liver lesion. Smooth liver surface contour. Normal gallbladder Pancreas: No pancreatic ductal dilatation or surrounding inflammatory changes. Spleen: Normal splenic size. Some wedge-shaped hypoattenuation is seen in the periphery of the inferior spleen best noted on coronal imaging 16/45) possibly related to artifact though a small splenic infarct could have this appearance. Adrenals/Urinary Tract: No concerning adrenal lesion. Kidneys enhance symmetrically and uniformly. Symmetric renal  excretion. No worrisome renal lesion. No urolithiasis or hydronephrosis. Urinary bladder is partially decompressed by inflated Foley catheter. Some excess catheter tubing is seen coiled within the urinary bladder Stomach/Bowel: Transesophageal tube tip and side port terminate within the gastric lumen. Diffuse edematous appearance of the gastric body with prominent rugal folds and mucosal hyperemia as well as extensive edematous mural changes of the duodenum, jejunum and portions of the ileum and portions of the proximal colon concerning for CT hypoperfusion complex/shock bowel in the setting of recent resuscitative efforts. No obstructive process is seen. Normal appendix in the right lower quadrant. Vascular/Lymphatic: Focal region of mid mesenteric hazy stranding with numerous reactive appearing clustered mid mesenteric lymph nodes compatible with mesenteritis (13/48). No other conspicuous or enlarged lymph nodes are seen in the abdomen or pelvis. No acute vascular abnormality. Portal and hepatic veins are widely patent. No major venous occlusions. Reproductive: Normal appearance of the uterus and adnexal structures. Other: No abdominopelvic free fluid or free gas. No bowel containing hernias. Focal skin thickening superficial to the sacrum as well as thickening along the subcutaneous fat plane. Additional focal skin thickening and soft tissue defect with gas and a rim enhancing air and fluid containing collection seen abutting the right ischial tuberosity. Musculoskeletal: Minimal cortical erosive changes of the right ischial tuberosity subjacent to the cutaneous/soft tissue defect. Additional minimal sclerotic changes and bony remodeling noted minimally at the. The osseous structures appear diffusely demineralized which may limit detection of small or nondisplaced fractures. Sacrum deep to overlying decubitus ulceration Review of the MIP images confirms the above findings. IMPRESSION: 1. Satisfactory  opacification of pulmonary arteries. No evidence of pulmonary artery embolism. 2. Extensive regions of airless lung in both lower lobes likely reflecting some extensive atelectatic change. Additional centrilobular ground-glass and patchy consolidation in the aerated portions of the lungs may reflect some superimposed infectious  process, sequela of aspiration and/or developing pulmonary edema in the setting of resuscitation. 3. Low positioning of the endotracheal tube 1 cm from the carina. Consider retraction 2-3 cm to position in the mid trachea. Secretions noted below the level of the endotracheal balloon. 4. Remote, nonunited left sixth and seventh lateral rib fractures. Remote right anterior fourth rib fracture. No acute rib or sternal fractures are seen. 5. Subcutaneous edematous changes subjacent to the posterior pacer pad. Correlate with visual inspection 6. Wedge-shaped hypoattenuation in the posteroinferior spleen may reflect a small splenic infarct. 7. Diffuse thickening of large and proximal small bowel loops with edematous mural changes and mucosal hyperemia suggestive of CT hypoperfusion complex/shock bowel. 8. Foley catheter in place. Excessive catheter tubing is noted within the bladder lumen. 9. Some mild sacral decubitus ulceration with minimal sclerotic changes of the adjacent sacral bone possibly reflecting some sequela of prior osteomyelitis. 10. More extensive decubitus ulceration and and cutaneous defect with minimal transcortical lucency of the right ischial tuberosity which could reflect early osteomyelitis. Electronically Signed   By: Lovena Le M.D.   On: 11-20-20 06:04   CT ABDOMEN PELVIS W CONTRAST  Result Date: Nov 20, 2020 CLINICAL DATA:  Altered mental status, found unresponsive, known sickle ulcerations. EXAM: CT ANGIOGRAPHY CHEST CT ABDOMEN AND PELVIS WITH CONTRAST TECHNIQUE: Multidetector CT imaging of the chest was performed using the standard protocol during bolus  administration of intravenous contrast. Multiplanar CT image reconstructions and MIPs were obtained to evaluate the vascular anatomy. Multidetector CT imaging of the abdomen and pelvis was performed using the standard protocol during bolus administration of intravenous contrast. CONTRAST:  190mL OMNIPAQUE IOHEXOL 350 MG/ML SOLN COMPARISON:  CT 04/15/2020 FINDINGS: CTA CHEST FINDINGS Cardiovascular: Satisfactory opacification the pulmonary arteries to the segmental level. No pulmonary artery filling defects are identified. Central pulmonary arteries are normal caliber. Normal heart size. No pericardial effusion. The aortic root is suboptimally assessed given cardiac pulsation artifact. The aorta is normal caliber. Left vertebral artery arises directly from the aortic arch between the left common carotid and subclavian artery origins. No acute abnormality of proximal great vessels. No major venous abnormality. No significant venous reflux. Mediastinum/Nodes: Endotracheal tube tip terminates in the low trachea, approximately 1 cm from the carina (9/57). Scattered secretions are seen below the level of the endotracheal balloon. Transesophageal tube tip and side port are distal to the GE junction. No worrisome thyroid nodules or masses. No conspicuous mediastinal, hilar or axillary adenopathy. Lungs/Pleura: Extensive areas of airless lung present throughout both lower lobes widespread multifocal patchy centrilobular ground-glass opacities and more diffuse consolidative densities elsewhere throughout both lungs. Some mild septal thickening is present. Scattered fluid-filled and thickened airways are noted. No sizable pneumothorax or visible effusion. Musculoskeletal: Remote appearing nonunited live sixth and seventh lateral rib fractures. Additional remote right anterior fourth rib fracture as well. No visible acute displaced rib fractures. No sternal fractures are identified. Costal cartilages appear intact. No acute  osseous abnormality of the included shoulders. No acute fracture or traumatic listhesis of the thoracic spine. Mild dextrocurvature centered at T8-T9. Some edematous changes are noted subjacent to the pacer pads along the posterior left chest wall, nonspecific given recent resuscitative efforts. Additional pacer pad anteriorly. Review of the MIP images confirms the above findings. CT ABDOMEN and PELVIS FINDINGS Hepatobiliary: Diffuse hepatic hypoattenuation compatible with hepatic steatosis. Sparing along the gallbladder fossa. No concerning focal liver lesion. Smooth liver surface contour. Normal gallbladder Pancreas: No pancreatic ductal dilatation or surrounding inflammatory changes. Spleen: Normal splenic  size. Some wedge-shaped hypoattenuation is seen in the periphery of the inferior spleen best noted on coronal imaging 16/45) possibly related to artifact though a small splenic infarct could have this appearance. Adrenals/Urinary Tract: No concerning adrenal lesion. Kidneys enhance symmetrically and uniformly. Symmetric renal excretion. No worrisome renal lesion. No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

## 2020-11-22 DEATH — deceased

## 2022-05-23 IMAGING — CT CT ABD-PELV W/ CM
2 of 6 series · 12 of 46 positions shown, 14 images · IV contrast (APPLIED)
Comparison: CT 04/15/2020

CLINICAL DATA: Altered mental status, found unresponsive, known
sickle ulcerations.

EXAM:
CT ANGIOGRAPHY CHEST
CT ABDOMEN AND PELVIS WITH CONTRAST
TECHNIQUE: Multidetector CT imaging of the chest was performed using the
standard protocol during bolus administration of intravenous
contrast. Multiplanar CT image reconstructions and MIPs were
obtained to evaluate the vascular anatomy. Multidetector CT imaging
of the abdomen and pelvis was performed using the standard protocol
during bolus administration of intravenous contrast.
CONTRAST:  100mL OMNIPAQUE IOHEXOL 350 MG/ML SOLN

[Series 14: abdomen 2.0 · axial · 0.73mm/px · z∈[-705,-275]mm · 9 of 245 slices shown, 11 images]
[im 15/245  soft-tissue]
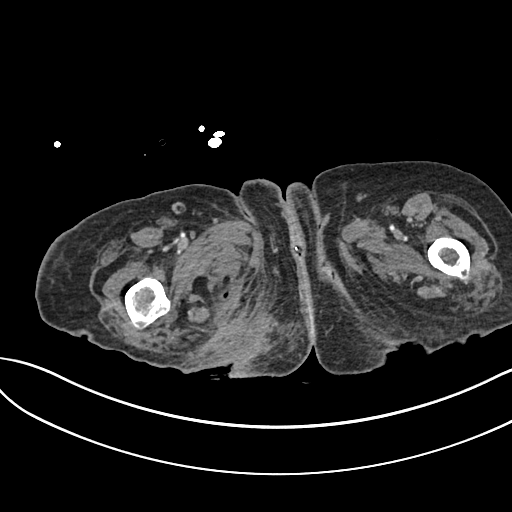
[im 15/245  bone]
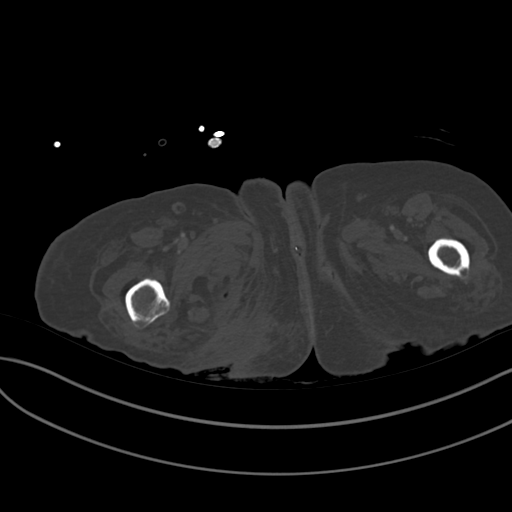
[im 44/245  soft-tissue]
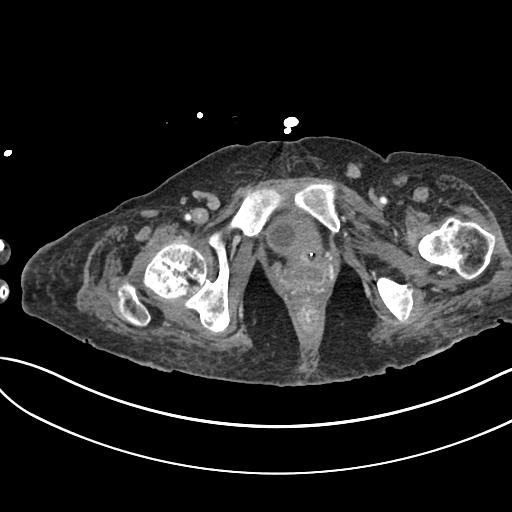
[im 72/245  soft-tissue]
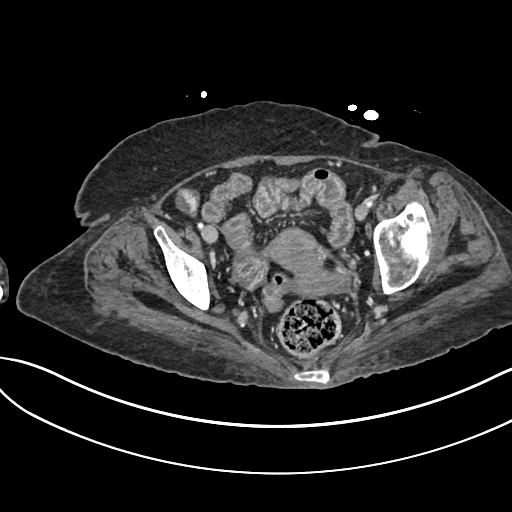
[im 101/245  soft-tissue]
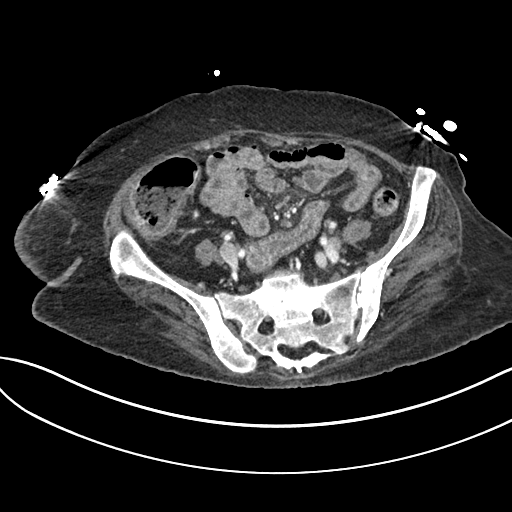
[im 130/245  soft-tissue]
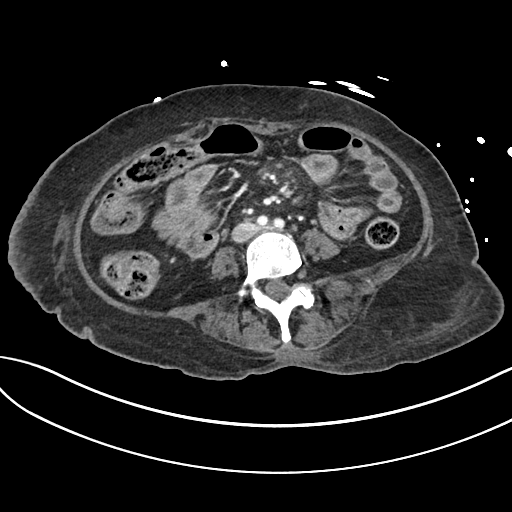
[im 144/245  soft-tissue]
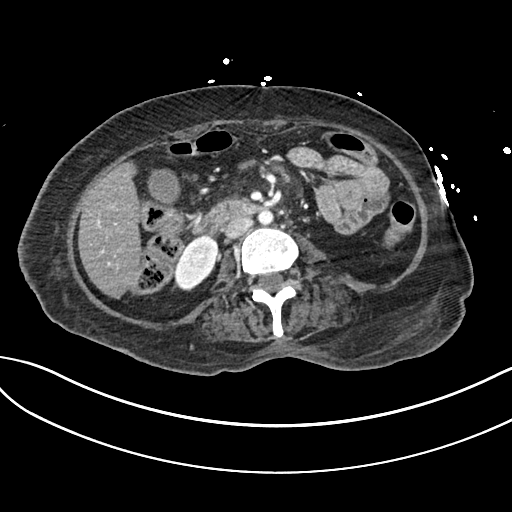
[im 173/245  soft-tissue]
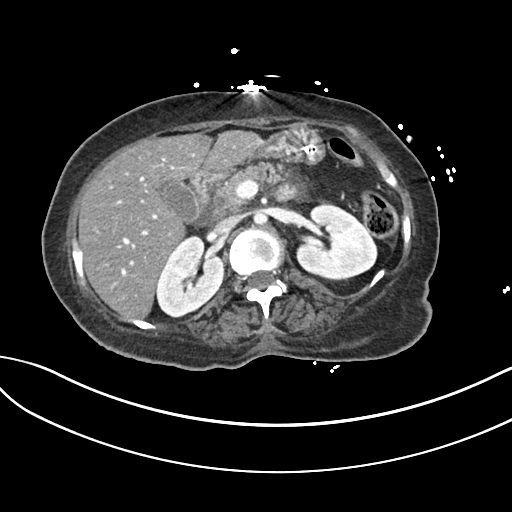
[im 201/245  soft-tissue]
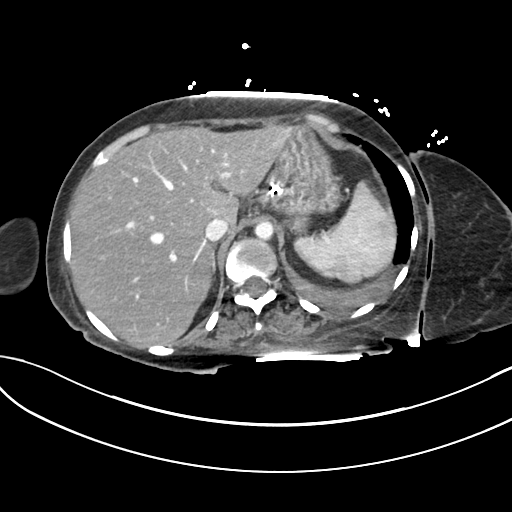
[im 230/245  soft-tissue]
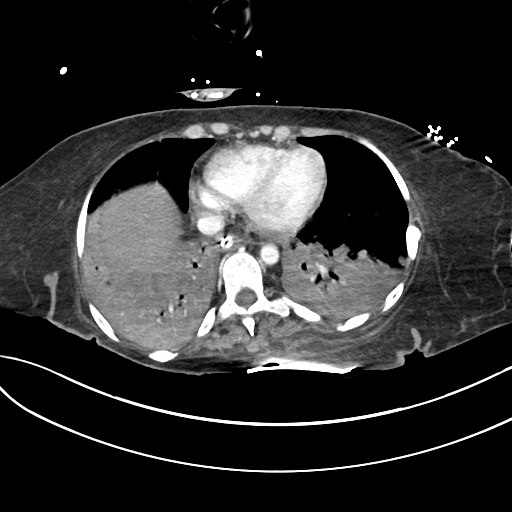
[im 230/245  bone]
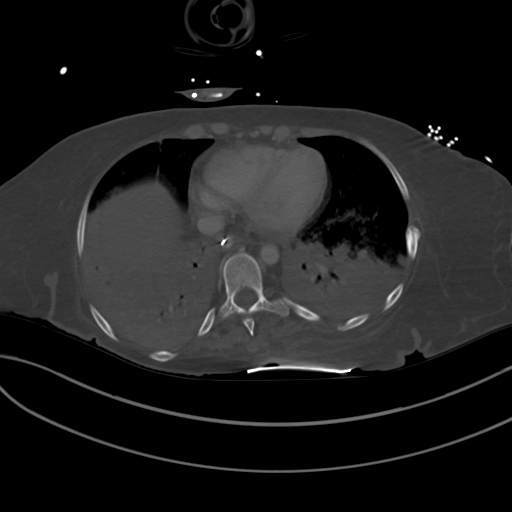

[Series 16: abdomen 3.0 mpr cor · coronal · 0.88mm/px · 3 of 82 slices shown]
[im 28/82  soft-tissue]
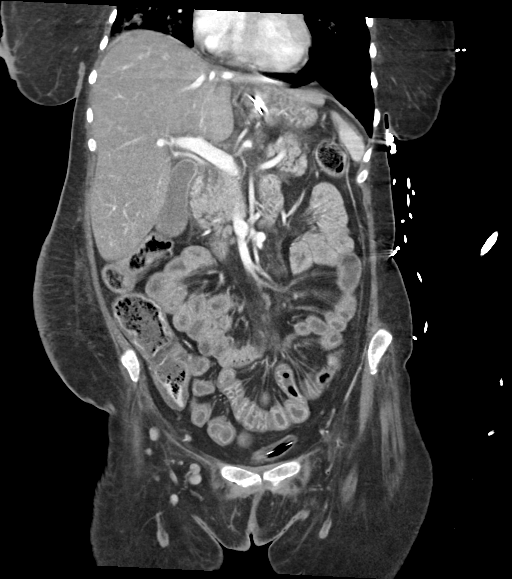
[im 37/82  soft-tissue]
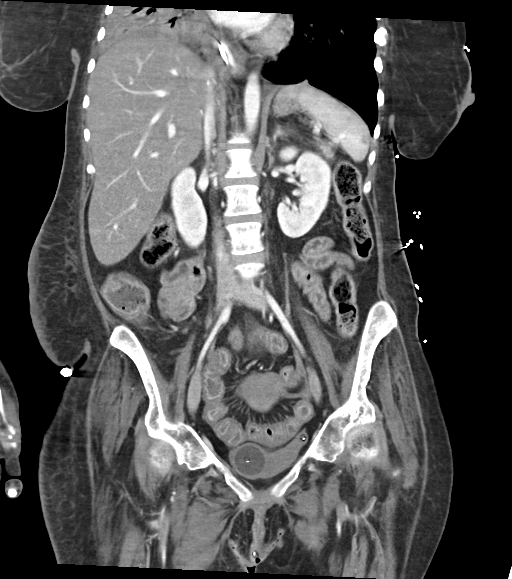
[im 46/82  soft-tissue]
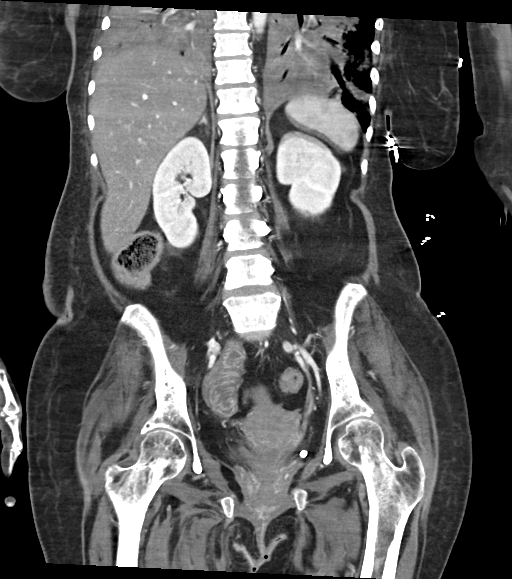

[12 of 46 positions shown; findings below may reference images not displayed]

FINDINGS: CTA CHEST FINDINGS

Cardiovascular: Satisfactory opacification the pulmonary arteries to
the segmental level. No pulmonary artery filling defects are
identified. Central pulmonary arteries are normal caliber. Normal
heart size. No pericardial effusion. The aortic root is suboptimally
assessed given cardiac pulsation artifact. The aorta is normal
caliber. Left vertebral artery arises directly from the aortic arch
between the left common carotid and subclavian artery origins. No
acute abnormality of proximal great vessels. No major venous
abnormality. No significant venous reflux.

Mediastinum/Nodes: Endotracheal tube tip terminates in the low
trachea, approximately 1 cm from the carina (9/57). Scattered
secretions are seen below the level of the endotracheal balloon.
Transesophageal tube tip and side port are distal to the GE
junction. No worrisome thyroid nodules or masses. No conspicuous
mediastinal, hilar or axillary adenopathy.

Lungs/Pleura: Extensive areas of airless lung present throughout
both lower lobes widespread multifocal patchy centrilobular
ground-glass opacities and more diffuse consolidative densities
elsewhere throughout both lungs. Some mild septal thickening is
present. Scattered fluid-filled and thickened airways are noted. No
sizable pneumothorax or visible effusion.

Musculoskeletal: Remote appearing nonunited live sixth and seventh
lateral rib fractures. Additional remote right anterior fourth rib
fracture as well. No visible acute displaced rib fractures. No
sternal fractures are identified. Costal cartilages appear intact.
No acute osseous abnormality of the included shoulders. No acute
fracture or traumatic listhesis of the thoracic spine. Mild
dextrocurvature centered at T8-T9. Some edematous changes are noted
subjacent to the pacer pads along the posterior left chest wall,
nonspecific given recent resuscitative efforts. Additional pacer pad
anteriorly.

Review of the MIP images confirms the above findings.

CT ABDOMEN and PELVIS FINDINGS

Hepatobiliary: Diffuse hepatic hypoattenuation compatible with
hepatic steatosis. Sparing along the gallbladder fossa. No
concerning focal liver lesion. Smooth liver surface contour. Normal
gallbladder

Pancreas: No pancreatic ductal dilatation or surrounding
inflammatory changes.

Spleen: Normal splenic size. Some wedge-shaped hypoattenuation is
seen in the periphery of the inferior spleen best noted on coronal
imaging 16/45) possibly related to artifact though a small splenic
infarct could have this appearance.

Adrenals/Urinary Tract: No concerning adrenal lesion. Kidneys
enhance symmetrically and uniformly. Symmetric renal excretion. No
worrisome renal lesion. No urolithiasis or hydronephrosis. Urinary
bladder is partially decompressed by inflated Foley catheter. Some
excess catheter tubing is seen coiled within the urinary bladder

Stomach/Bowel: Transesophageal tube tip and side port terminate
within the gastric lumen. Diffuse edematous appearance of the
gastric body with prominent rugal folds and mucosal hyperemia as
well as extensive edematous mural changes of the duodenum, jejunum
and portions of the ileum and portions of the proximal colon
concerning for CT hypoperfusion complex/shock bowel in the setting
of recent resuscitative efforts. No obstructive process is seen.
Normal appendix in the right lower quadrant.

Vascular/Lymphatic: Focal region of mid mesenteric hazy stranding
with numerous reactive appearing clustered mid mesenteric lymph
nodes compatible with mesenteritis (13/48). No other conspicuous or
enlarged lymph nodes are seen in the abdomen or pelvis. No acute
vascular abnormality. Portal and hepatic veins are widely patent. No
major venous occlusions.

Reproductive: Normal appearance of the uterus and adnexal
structures.

Other: No abdominopelvic free fluid or free gas. No bowel containing
hernias. Focal skin thickening superficial to the sacrum as well as
thickening along the subcutaneous fat plane. Additional focal skin
thickening and soft tissue defect with gas and a rim enhancing air
and fluid containing collection seen abutting the right ischial
tuberosity.

Musculoskeletal: Minimal cortical erosive changes of the right
ischial tuberosity subjacent to the cutaneous/soft tissue defect.
Additional minimal sclerotic changes and bony remodeling noted
minimally at the. The osseous structures appear diffusely
demineralized which may limit detection of small or nondisplaced
fractures. Sacrum deep to overlying decubitus ulceration

Review of the MIP images confirms the above findings.
IMPRESSION: 1. Satisfactory opacification of pulmonary arteries. No evidence of
pulmonary artery embolism.
2. Extensive regions of airless lung in both lower lobes likely
reflecting some extensive atelectatic change. Additional
centrilobular ground-glass and patchy consolidation in the aerated
portions of the lungs may reflect some superimposed infectious
process, sequela of aspiration and/or developing pulmonary edema in
the setting of resuscitation.
3. Low positioning of the endotracheal tube 1 cm from the carina.
Consider retraction 2-3 cm to position in the mid trachea.
Secretions noted below the level of the endotracheal balloon.
4. Remote, nonunited left sixth and seventh lateral rib fractures.
Remote right anterior fourth rib fracture. No acute rib or sternal
fractures are seen.
5. Subcutaneous edematous changes subjacent to the posterior pacer
pad. Correlate with visual inspection
6. Wedge-shaped hypoattenuation in the posteroinferior spleen may
reflect a small splenic infarct.
7. Diffuse thickening of large and proximal small bowel loops with
edematous mural changes and mucosal hyperemia suggestive of CT
hypoperfusion complex/shock bowel.
8. Foley catheter in place. Excessive catheter tubing is noted
within the bladder lumen.
9. Some mild sacral decubitus ulceration with minimal sclerotic
changes of the adjacent sacral bone possibly reflecting some sequela
of prior osteomyelitis.
10. More extensive decubitus ulceration and and cutaneous defect
with minimal transcortical lucency of the right ischial tuberosity
which could reflect early osteomyelitis.
